# Patient Record
Sex: Female | Born: 1970 | Hispanic: Yes | Marital: Single | Smoking: Never smoker
Health system: Southern US, Community
[De-identification: ages and names within clinical notes are randomized; demographics above are authoritative.]

## PROBLEM LIST (undated history)

## (undated) DIAGNOSIS — E119 Type 2 diabetes mellitus without complications: Secondary | ICD-10-CM

---

## 2006-07-07 ENCOUNTER — Ambulatory Visit: Payer: Self-pay | Admitting: General Practice

## 2011-01-24 ENCOUNTER — Emergency Department: Payer: Self-pay | Admitting: Internal Medicine

## 2012-08-01 ENCOUNTER — Inpatient Hospital Stay: Payer: Self-pay | Admitting: Internal Medicine

## 2012-08-01 LAB — CBC WITH DIFFERENTIAL/PLATELET
Basophil #: 0 10*3/uL (ref 0.0–0.1)
Eosinophil %: 0 %
HCT: 37.9 % (ref 35.0–47.0)
HGB: 12.5 g/dL (ref 12.0–16.0)
Lymphocyte #: 0.7 10*3/uL — ABNORMAL LOW (ref 1.0–3.6)
MCHC: 33 g/dL (ref 32.0–36.0)
Monocyte #: 0.6 x10 3/mm (ref 0.2–0.9)
Monocyte %: 8.9 %
Neutrophil %: 79.7 %
Platelet: 140 10*3/uL — ABNORMAL LOW (ref 150–440)
WBC: 6.6 10*3/uL (ref 3.6–11.0)

## 2012-08-01 LAB — BASIC METABOLIC PANEL
Calcium, Total: 8.6 mg/dL (ref 8.5–10.1)
Chloride: 100 mmol/L (ref 98–107)
EGFR (African American): >60
EGFR (Non-African Amer.): >60

## 2012-08-01 LAB — URINALYSIS, COMPLETE
Glucose,UR: >=500 mg/dL (ref 0–75)
Leukocyte Esterase: NEGATIVE
Nitrite: NEGATIVE
RBC,UR: <1 /HPF (ref 0–5)
WBC UR: 2 /HPF (ref 0–5)

## 2012-08-01 LAB — WET PREP, GENITAL

## 2012-08-02 LAB — BASIC METABOLIC PANEL
Anion Gap: 14 (ref 7–16)
BUN: 7 mg/dL (ref 7–18)
BUN: 6 mg/dL — ABNORMAL LOW (ref 7–18)
Chloride: 107 mmol/L (ref 98–107)
Co2: 16 mmol/L — ABNORMAL LOW (ref 21–32)
Co2: 18 mmol/L — ABNORMAL LOW (ref 21–32)
EGFR (African American): >60
EGFR (Non-African Amer.): >60
Glucose: 124 mg/dL — ABNORMAL HIGH (ref 65–99)
Osmolality: 273 (ref 275–301)
Potassium: 3.5 mmol/L (ref 3.5–5.1)
Sodium: 137 mmol/L (ref 136–145)

## 2012-08-03 LAB — URINALYSIS, COMPLETE
Bilirubin,UR: NEGATIVE
Glucose,UR: >=500 mg/dL (ref 0–75)
Nitrite: NEGATIVE
Ph: 6 (ref 4.5–8.0)
Protein: NEGATIVE
RBC,UR: 1 /HPF (ref 0–5)
Specific Gravity: 1.003 (ref 1.003–1.030)
WBC UR: 3 /HPF (ref 0–5)

## 2012-08-03 LAB — CBC WITH DIFFERENTIAL/PLATELET
Basophil #: 0 10*3/uL (ref 0.0–0.1)
Basophil %: 0.4 %
Eosinophil #: 0 10*3/uL (ref 0.0–0.7)
Eosinophil %: 0.2 %
HCT: 36.3 % (ref 35.0–47.0)
HGB: 11.8 g/dL — ABNORMAL LOW (ref 12.0–16.0)
MCH: 27.2 pg (ref 26.0–34.0)
MCHC: 32.6 g/dL (ref 32.0–36.0)
Monocyte #: 0.7 x10 3/mm (ref 0.2–0.9)
Monocyte %: 9.5 %
Neutrophil %: 74.6 %
RBC: 4.35 10*6/uL (ref 3.80–5.20)
WBC: 7.2 10*3/uL (ref 3.6–11.0)

## 2012-08-03 LAB — BASIC METABOLIC PANEL
BUN: 4 mg/dL — ABNORMAL LOW (ref 7–18)
Calcium, Total: 7.8 mg/dL — ABNORMAL LOW (ref 8.5–10.1)
Co2: 19 mmol/L — ABNORMAL LOW (ref 21–32)
Creatinine: 0.38 mg/dL — ABNORMAL LOW (ref 0.60–1.30)
EGFR (Non-African Amer.): >60
Glucose: 148 mg/dL — ABNORMAL HIGH (ref 65–99)
Potassium: 3.1 mmol/L — ABNORMAL LOW (ref 3.5–5.1)
Sodium: 139 mmol/L (ref 136–145)

## 2012-08-03 LAB — URINE CULTURE

## 2012-08-04 LAB — BASIC METABOLIC PANEL
Anion Gap: 11 (ref 7–16)
BUN: 2 mg/dL — ABNORMAL LOW (ref 7–18)
Calcium, Total: 8.1 mg/dL — ABNORMAL LOW (ref 8.5–10.1)
Creatinine: 0.41 mg/dL — ABNORMAL LOW (ref 0.60–1.30)
EGFR (African American): >60
EGFR (Non-African Amer.): >60
Glucose: 138 mg/dL — ABNORMAL HIGH (ref 65–99)
Potassium: 3.1 mmol/L — ABNORMAL LOW (ref 3.5–5.1)
Sodium: 141 mmol/L (ref 136–145)

## 2012-08-07 LAB — CULTURE, BLOOD (SINGLE)

## 2012-08-26 LAB — URINALYSIS, COMPLETE
Bilirubin,UR: NEGATIVE
Glucose,UR: >=500 mg/dL (ref 0–75)
Protein: 30
RBC,UR: 3 /HPF (ref 0–5)
Specific Gravity: 1.02 (ref 1.003–1.030)
WBC UR: 86 /HPF (ref 0–5)

## 2012-08-26 LAB — CBC
HCT: 38.4 % (ref 35.0–47.0)
HGB: 12.5 g/dL (ref 12.0–16.0)
MCH: 27 pg (ref 26.0–34.0)
Platelet: 247 10*3/uL (ref 150–440)
RBC: 4.63 10*6/uL (ref 3.80–5.20)
RDW: 15.5 % — ABNORMAL HIGH (ref 11.5–14.5)

## 2012-08-26 LAB — COMPREHENSIVE METABOLIC PANEL
Alkaline Phosphatase: 132 U/L (ref 50–136)
Calcium, Total: 8.7 mg/dL (ref 8.5–10.1)
Co2: 20 mmol/L — ABNORMAL LOW (ref 21–32)
Creatinine: 0.56 mg/dL — ABNORMAL LOW (ref 0.60–1.30)
EGFR (African American): >60
Glucose: 233 mg/dL — ABNORMAL HIGH (ref 65–99)
Osmolality: 269 (ref 275–301)
SGPT (ALT): 50 U/L (ref 12–78)
Sodium: 131 mmol/L — ABNORMAL LOW (ref 136–145)
Total Protein: 8 g/dL (ref 6.4–8.2)

## 2012-08-27 ENCOUNTER — Inpatient Hospital Stay: Payer: Self-pay | Admitting: Internal Medicine

## 2012-08-28 LAB — CBC WITH DIFFERENTIAL/PLATELET
Eosinophil #: 0 10*3/uL (ref 0.0–0.7)
HCT: 36.7 % (ref 35.0–47.0)
HGB: 12 g/dL (ref 12.0–16.0)
MCHC: 32.8 g/dL (ref 32.0–36.0)
MCV: 83 fL (ref 80–100)
Monocyte #: 0.9 x10 3/mm (ref 0.2–0.9)
Monocyte %: 9 %
Neutrophil #: 7.5 10*3/uL — ABNORMAL HIGH (ref 1.4–6.5)
Platelet: 233 10*3/uL (ref 150–440)
RBC: 4.44 10*6/uL (ref 3.80–5.20)
WBC: 10 10*3/uL (ref 3.6–11.0)

## 2012-08-28 LAB — BASIC METABOLIC PANEL
Anion Gap: 11 (ref 7–16)
Creatinine: 0.48 mg/dL — ABNORMAL LOW (ref 0.60–1.30)
EGFR (Non-African Amer.): >60
Glucose: 272 mg/dL — ABNORMAL HIGH (ref 65–99)
Potassium: 3.4 mmol/L — ABNORMAL LOW (ref 3.5–5.1)

## 2012-08-29 LAB — CULTURE, BLOOD (SINGLE)

## 2012-09-04 LAB — CULTURE, BLOOD (SINGLE)

## 2013-05-05 ENCOUNTER — Emergency Department: Payer: Self-pay | Admitting: Internal Medicine

## 2013-05-05 LAB — BASIC METABOLIC PANEL
Anion Gap: 6 — ABNORMAL LOW (ref 7–16)
BUN: 10 mg/dL (ref 7–18)
Chloride: 102 mmol/L (ref 98–107)
Co2: 24 mmol/L (ref 21–32)
EGFR (African American): >60
EGFR (Non-African Amer.): >60
Glucose: 434 mg/dL — ABNORMAL HIGH (ref 65–99)
Sodium: 132 mmol/L — ABNORMAL LOW (ref 136–145)

## 2013-05-05 LAB — CBC
HCT: 35.3 % (ref 35.0–47.0)
HGB: 11.3 g/dL — ABNORMAL LOW (ref 12.0–16.0)
MCH: 24.7 pg — ABNORMAL LOW (ref 26.0–34.0)
RBC: 4.59 10*6/uL (ref 3.80–5.20)
RDW: 16.9 % — ABNORMAL HIGH (ref 11.5–14.5)
WBC: 7.9 10*3/uL (ref 3.6–11.0)

## 2013-05-05 LAB — HEPATIC FUNCTION PANEL A (ARMC)
Albumin: 3.7 g/dL (ref 3.4–5.0)
Alkaline Phosphatase: 109 U/L (ref 50–136)
Bilirubin, Direct: <0.1 mg/dL (ref 0.00–0.20)
Bilirubin,Total: 0.3 mg/dL (ref 0.2–1.0)
SGOT(AST): 32 U/L (ref 15–37)
SGPT (ALT): 69 U/L (ref 12–78)

## 2013-05-05 LAB — LIPASE, BLOOD: Lipase: 201 U/L (ref 73–393)

## 2014-06-29 IMAGING — CT CT ABD-PELV W/ CM
1 of 2 series · 15 of 32 positions shown, 19 images · IV contrast (isovue)
Comparison: 08/01/2012

REASON FOR EXAM: (1) Bacteremia, recent pyelonephritis.; (2) Bacteremia,
recent pyelonephritis.
COMMENTS:

PROCEDURE:     CT  - CT ABDOMEN / PELVIS  W  - August 28, 2012 [DATE]
RESULT:     History: Bacteremia, recent pyelonephritis
TECHNIQUE: Multiple axial images of the abdomen and pelvis were performed
from the lung bases to the pubic symphysis, with a p.o. contrast and with
100 ml of Isovue 300 intravenous contrast.

[Series 2: 3mm soft tissue · axial · 0.77mm/px · z∈[-304,+140]mm · 15 of 162 slices shown, 19 images]
[im 7/162  soft-tissue]
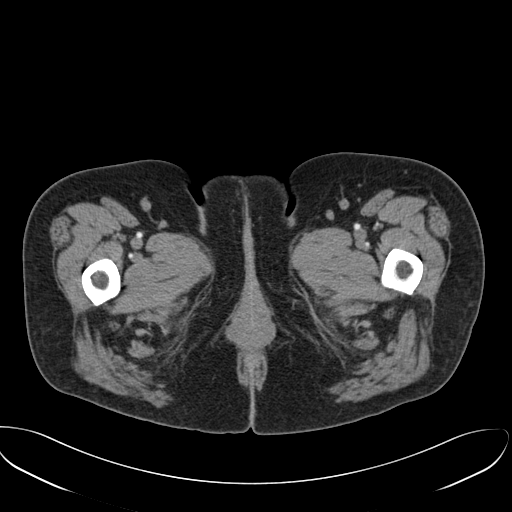
[im 7/162  bone]
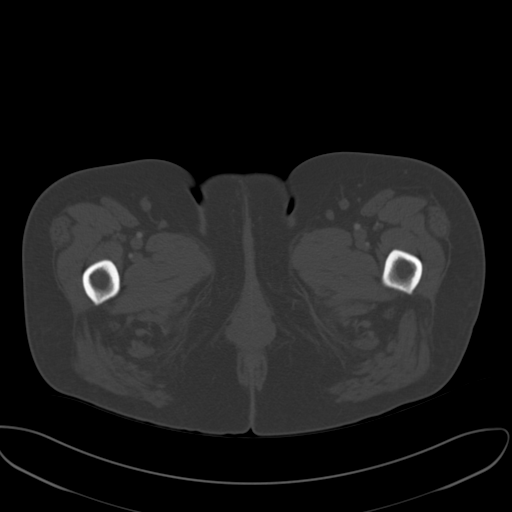
[im 21/162  soft-tissue]
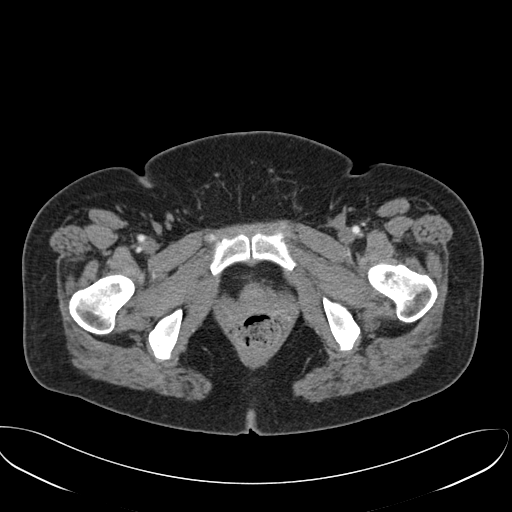
[im 34/162  soft-tissue]
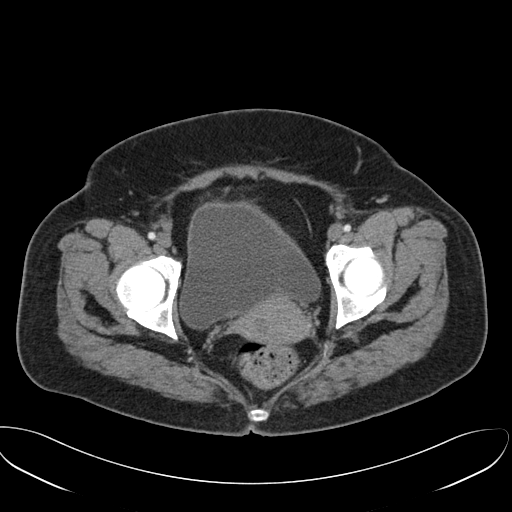
[im 47/162  soft-tissue]
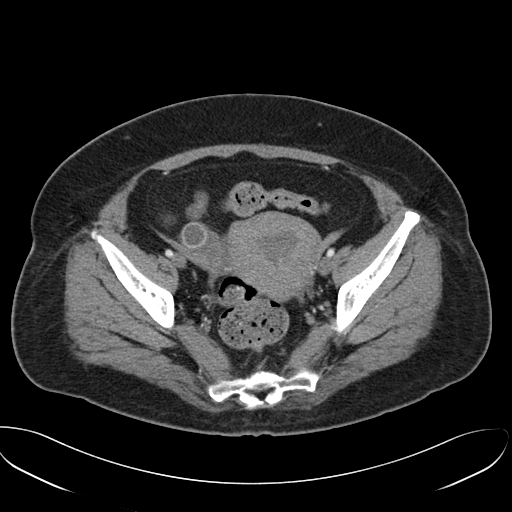
[im 54/162  soft-tissue]
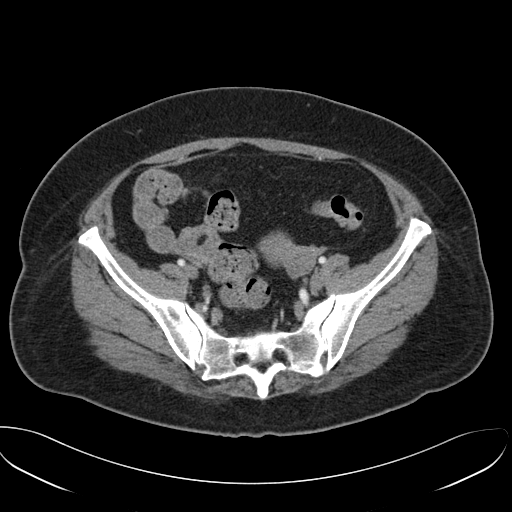
[im 68/162  soft-tissue]
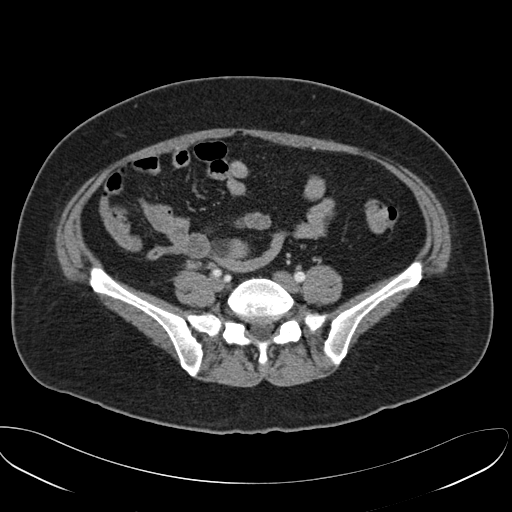
[im 81/162  soft-tissue]
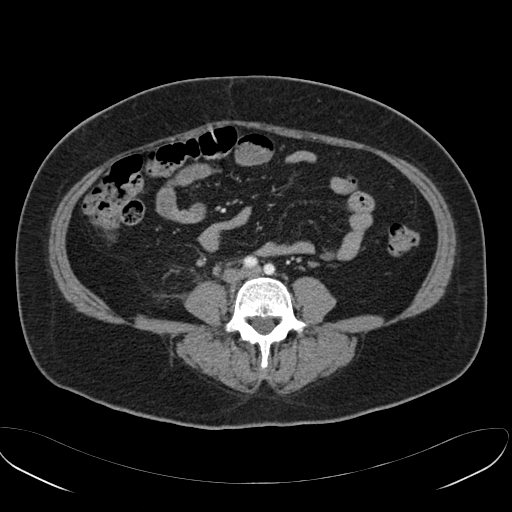
[im 94/162  soft-tissue]
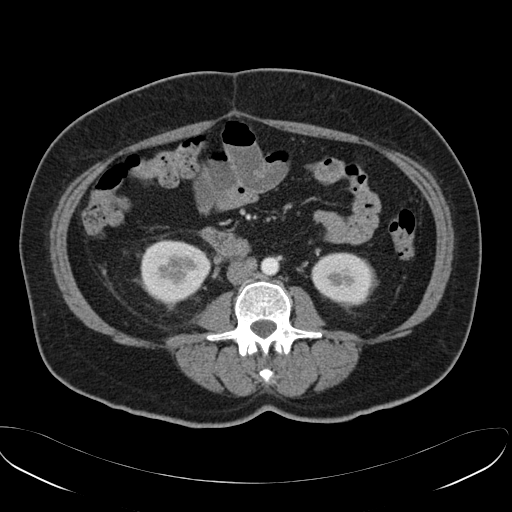
[im 108/162  soft-tissue]
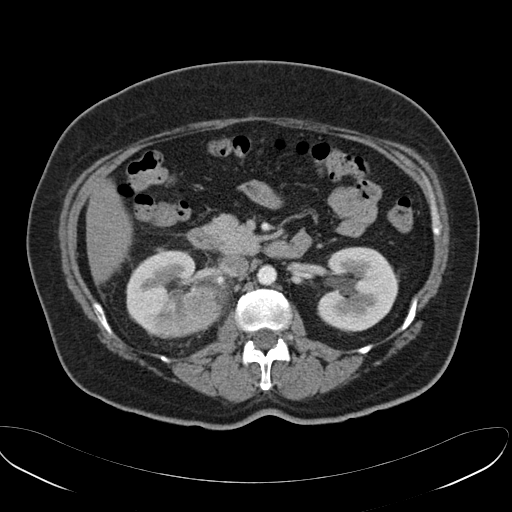
[im 108/162  bone]
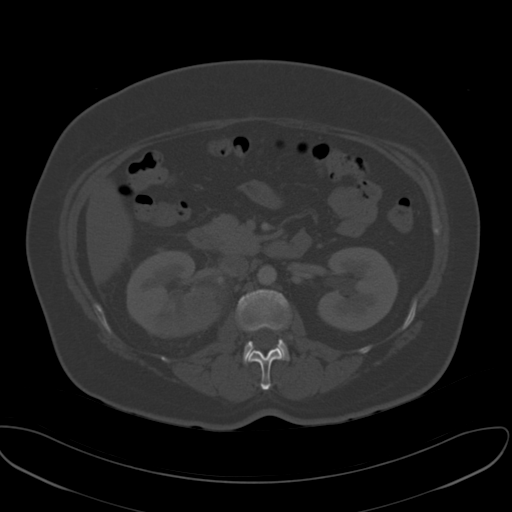
[im 115/162  soft-tissue]
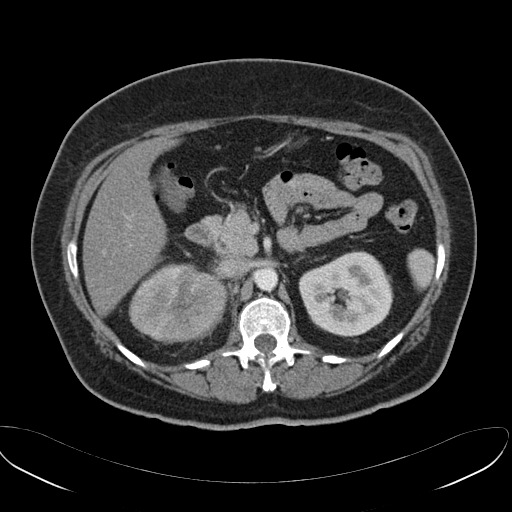
[im 128/162  soft-tissue]
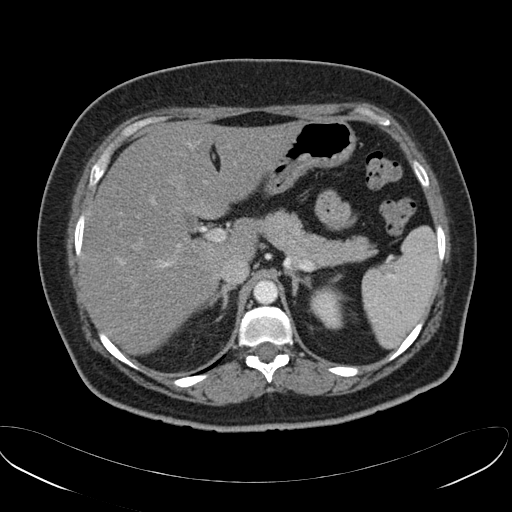
[im 135/162  lung]
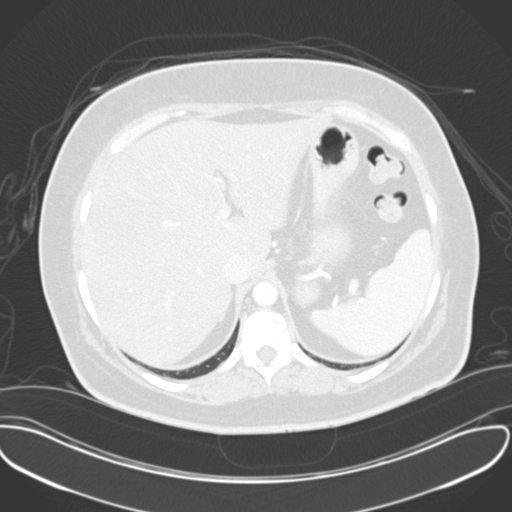
[im 141/162  soft-tissue]
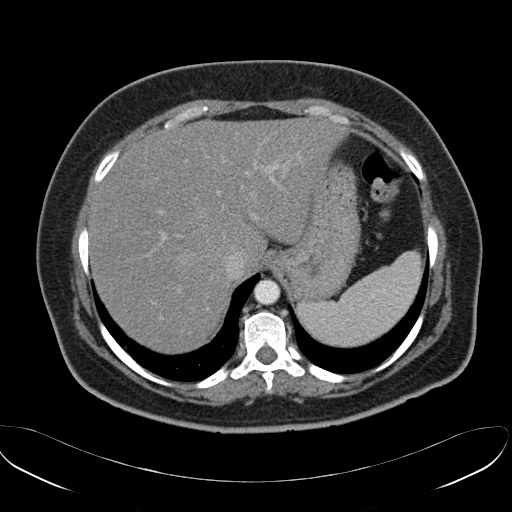
[im 141/162  lung]
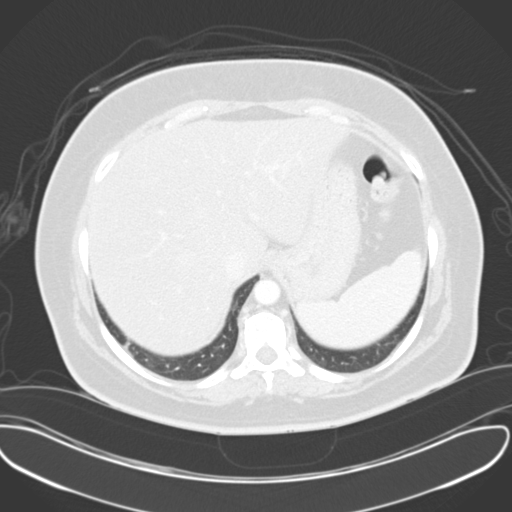
[im 148/162  lung]
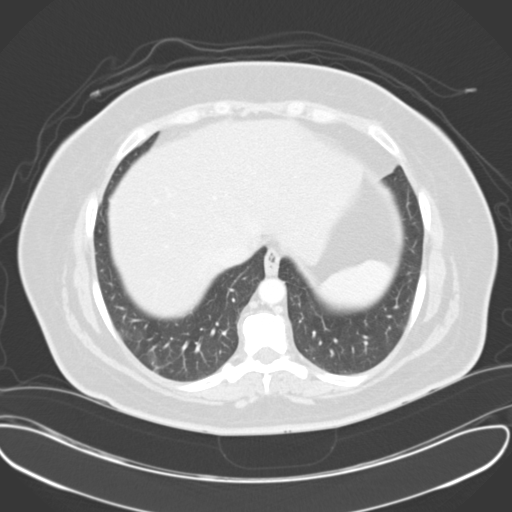
[im 155/162  soft-tissue]
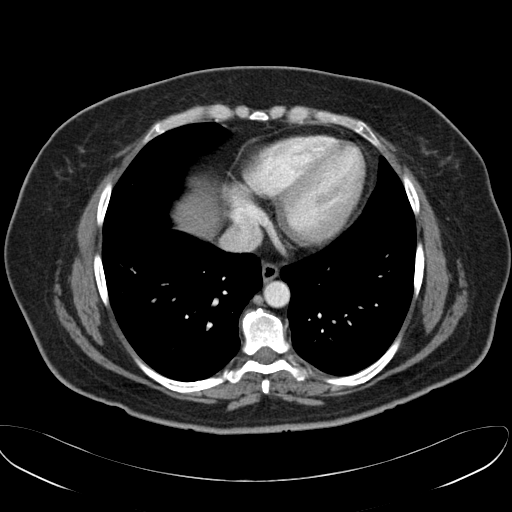
[im 155/162  lung]
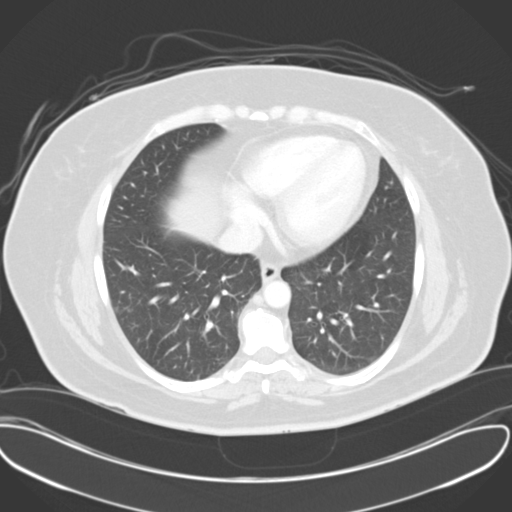

[15 of 32 positions shown; findings below may reference images not displayed]

FINDINGS: The lung bases are clear. There is no pneumothorax. The heart size is
normal.

The liver is a low in attenuation likely secondary to hepatic steatosis.
There is no intrahepatic or extrahepatic biliary ductal dilatation. The
gallbladder is unremarkable. The spleen demonstrates no focal abnormality.
The left kidney, adrenal glands, and pancreas are normal. There is severe
hypoenhancement of the upper half of the right kidney the most concerning
for pyelonephritis. There is no obstructive uropathy. The bladder is
unremarkable.

The stomach, duodenum, small intestine, and large intestine demonstrate no
contrast extravasation or dilatation.  There is no pneumoperitoneum,
pneumatosis, or portal venous gas. There is no abdominal or pelvic free
fluid. There is no lymphadenopathy.

The abdominal aorta is normal in caliber.

The osseous structures are unremarkable.
IMPRESSION: 1.  There is severe hypoenhancement of the upper half of the right kidney
the most concerning for pyelonephritis. Infectious disease consultation
recommended.

[REDACTED]

## 2014-11-16 NOTE — H&P (Signed)
PATIENT NAME:  [name], [name] MR#:  [id number] DATE OF BIRTH:  07-25-1971  DATE OF ADMISSION:  08/27/2012  REFERRING PHYSICIAN:  Dr. Janalyn HarderKaminski, David  PRIMARY CARE PHYSICIAN:  [facility], cannot recall the name.   CHIEF COMPLAINT:  Fever, chills, lower abdominal pain.   HISTORY OF PRESENT ILLNESS: This is a 44 year old Hispanic female with history of diabetes mellitus type 2 on metformin, Spanish-speaking, had to [facility]  for translation NAD. The patient was recently admitted and discharged from [facility] for what thought to be UTI/pyelonephritis, as well as Klebsiella pneumonia bacteremia. The patient was discharged home on p.o. Cipro. The patient presents with complaints of lower abdominal tenderness resembling the last episode, as well as fever, chills and feeling weak. The patient was found to be febrile of 102.9, had significant leukocytosis 15.5 and was tachycardic. The patient also did show 87 white blood cells. The patient had lower abdomen tenderness mainly on the right lower quadrant and reports this pain resembles the pain she had last time, denies any nausea, vomiting, constipation, diarrhea, bright red blood per rectum, chest pain or shortness of breath, as well the patient noticed some vaginal discharge during her last admission where she did have workup for chlamydia and gonococcal, which was negative. Hospital services requested to admit the patient for further management and treatment for her UTI.  PAST MEDICAL HISTORY:  Type 2 diabetes on metformin.   PAST SURGICAL HISTORY:  Bilateral partial salpingectomy in 2002 for sterilization.   SOCIAL HISTORY:  Nonsmoker. No history of alcohol or drug abuse.  She is married, living with her husband.  Speaks good English.  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   FAMILY HISTORY:  Significant for diabetes mellitus type 2 in her mother.   HOME  MEDICATIONS:  Metformin 1000 mg p.o. b.i.d. Finished her course of Cipro.   ALLERGIES:  No known drug allergies.   REVIEW OF SYSTEMS:  The patient denies any fever, but has complaints of chills, rigors and fatigue and weakness.  EYES:  Denies blurry vision, double vision, inflammation or glaucoma and she denies tinnitus, ear pain, hearing loss, epistaxis or discharge.  RESPIRATORY:  Denies cough, wheezing, hemoptysis, dyspnea.  CARDIOVASCULAR: Denies chest pain, orthopnea, edema, arrhythmia, palpitations, syncope.  GASTROINTESTINAL:  Denies nausea, vomiting, diarrhea. Complains of abdominal pain mainly in the lower abdominal area more pronounced on the right lower quadrant, denies any rectal bleed, constipation or hemorrhoids.  GENITOURINARY:  Complains of dysuria. Denies hematuria or renal colic and denies polyuria, polydipsia, heat or cold intolerance.  HEMATOLOGY:  Denies anemia, easy bruising, bleeding, diathesis.  INTEGUMENTARY:  Denies acne, rash or skin lesions.  MUSCULOSKELETAL:  Denies neck pain, shoulder pain, knee pain, arthritis, cramps or gout.  NEUROLOGICAL:  Denies numbness, dysarthria, epilepsy, tremors, vertigo, CVA, TIA or seizures.  PSYCHIATRIC:  Denies anxiety, schizophrenia, substance abuse, alcohol abuse, bipolar disorder.   PHYSICAL EXAMINATION: VITAL SIGNS:  Temperature 98.3, T-max 102.9, pulse 115, respiratory rate 20, blood pressure 115/67, saturating 98% on room air.  GENERAL:  Well-nourished female looks comfortable in no apparent distress.  HEENT:  Head atraumatic, normocephalic. Pupils equal, reactive to light. Pink conjunctivae. Anicteric sclerae. Moist oral mucosa.  NECK:  Supple. No thyromegaly. No JVD.  CHEST:  Good air entry bilaterally. No wheezing, rales, rhonchi. ABDOMEN:  Has right lower abdomen tenderness, as well in the middle lower abdomen area to a lesser  degree but no rebound and no guarding. Bowel sounds present. No hepatosplenomegaly. CVA tenderness  was not noticed in either side.  EXTREMITIES:  No edema. No clubbing. No cyanosis.  PSYCHIATRIC:  Appropriate affect. Awake, alert x 3. Intact judgment and insight.  NEUROLOGIC:  Cranial nerves grossly intact. Motor is 5/5.  SKIN:  Normal skin turgor. Warm and dry.   PERTINENT LABORATORIES:  Urine pregnancy test negative. Glucose 233, BUN 9, creatinine 0.56, sodium 131, potassium 3.6, chloride 100, CO2 of 20. White blood cells 15.5, hemoglobin 12.5, hematocrit 38.4, platelets 247. Urinalysis showing leukocyte esterase +3 with 86 white blood cells.   ASSESSMENT AND PLAN: 1.  Sepsis:  This is most likely related to urinary tract infection. The patient had recent CT abdomen with IV contrast on the 6th of January which did not show any acute finding beside right perinephric stranding. The patient will be started on Rocephin instead of Cipro as had recurrence with her urinary tract infection on the Cipro. Will recheck blood cultures, will follow on urine culture and will adjust antibiotics if needed, and given the fact the patient had lower abdominal tenderness, will check transvaginal ultrasound even though she had negative workup for chlamydia and gonococcal during her last admission. 2.  Hyponatremia:  This is most likely due to dehydration and poor p.o. intake. We will continue with normal saline. We will recheck in a.m. 3.  Diabetes mellitus:  Will hold metformin. We will have her on insulin sliding scale. 4.  Deep vein thrombosis prophylaxis:  Subcutaneous heparin, GI prophylaxis on Protonix.  CODE STATUS:  FULL CODE.  TOTAL TIME SPENT ON ADMISSION AND PATIENT CARE:  55 minutes.   ____________________________ Starleen Arms, MD dse:ce D: 08/27/2012 01:34:44 ET T: 08/27/2012 12:55:14 ET JOB#: [id number]  cc: Starleen Arms, MD, <Dictator> Ruston Fedora Teena Irani MD ELECTRONICALLY SIGNED 08/29/2012 2:14

## 2014-11-16 NOTE — Discharge Summary (Signed)
PATIENT NAME:  [name], [name] MR#:  [id number] DATE OF BIRTH:  October 06, 1970  DATE OF ADMISSION:  08/01/2012 DATE OF DISCHARGE:  08/04/2012  ADMITTING PHYSICIAN:  Carney CornersAmir M. Rudene Rearwish, MD  DISCHARGING PHYSICIAN:  Enid Baasadhika Analyssa Downs, MD  PRIMARY CARE PHYSICIAN:  None.   CONSULTATIONS IN THE HOSPITAL:  Endocrinology consultation by Dr. Tedd SiasSolum.   DISCHARGE DIAGNOSES:   1.  Systemic inflammatory response syndrome.  2.  Acute pyelonephritis.  3.  Bacteremia.  4.  Klebsiella pneumonia.  5.  Diabetic ketoacidosis on admission.  6.  Uncontrolled type 2 diabetes mellitus with hemoglobin A1c of 11.8.  7.  Hypokalemia.   DISCHARGE MEDICATIONS:   1.  Metformin 1000 mg p.o. t.i.d.  2.  Ciprofloxacin 500 mg p.o. b.i.d. for 11 more days.   DISCHARGE DIET:  ADA 1800 diet.   DISCHARGE ACTIVITY:  As tolerated.   FOLLOWUP INSTRUCTIONS:   1.  PCP followup in 2 weeks.  2.  Follow up with Dr. Tedd SiasSolum in 2 weeks.   LABS AND IMAGING STUDIES PRIOR TO DISCHARGE:   1.  WBC 7.3, hemoglobin 11.3, hematocrit 36.3, platelet count 154.  2.  Sodium 141, potassium 3.1, chloride 108, bicarbonate 22, BUN 2, creatinine 0.4, glucose 138, calcium 8.1.  3.  Urinalysis negative for any infection.  4.  Repeat blood cultures are negative. Initial blood cultures on admission were positive for Klebsiella pneumonia pretty much pansensitive. Her chlamydia and gonococcal DNA probe from vaginal secretions has being negative.   BRIEF HOSPITAL COURSE:  The patient is a 44 year old Hispanic female with history of type 2 diabetes mellitus on metformin who has not been taking her medications for the past month who presented to the hospital secondary to fever, chills and elevated blood sugar.  1.  Systemic inflammatory response syndrome secondary to acute pyelonephritis. She did have positive blood cultures with Klebsiella pneumonia and I am not sure what the source is, possibly UTI and pyelonephritis as she did have some urinary symptoms on  admission. She was spiking high fevers and initially started on IV Invanz until sensitivities are back and now changed to p.o. Cipro for 11 more days to finish a 2-week course of antibiotics. Her repeat blood cultures prior to discharge have been negative so far.  2.  DKA secondary to noncompliance. Hemoglobin A1c is elevated. The patient cannot take insulin secondary to her _____. She probably will have better control if she will be compliant with her oral medications. She was seen by Dr. Tedd SiasSolum from an endocrinology standpoint and was started on metformin 1000 mg p.o. b.i.d. and the patient was stressed the importance of taking her medication and checking her sugars at least twice a day. She will follow up with Dr. Tedd SiasSolum in the clinic on 08/19/2012. Her sugars have been better controlled less than _____ prior to discharge.  3.  Hypokalemia. Potassium is low and will be replaced while in the hospital.   Her course has been otherwise uneventful in the hospital.   DISCHARGE CONDITION:  Stable.   DISCHARGE DISPOSITION:  Home.   TIME SPENT ON DISCHARGE:  45 minutes.    ____________________________ Enid Baasadhika Elissia Spiewak, MD rk:si D: 08/04/2012 14:39:00 ET T: 08/04/2012 17:19:21 ET JOB#: [id number]  cc: Enid Baasadhika Tayva Easterday, MD, <Dictator> A. Wendall MolaMelissa Solum, MD Enid BaasADHIKA Marili Vader MD ELECTRONICALLY SIGNED 08/05/2012 14:14

## 2014-11-16 NOTE — Consult Note (Signed)
PATIENT NAME:  [name], [name] MR#:  [id number] DATE OF BIRTH:  08/23/1970  DATE OF CONSULTATION:  08/02/2012  REFERRING PHYSICIAN:  Enid Baas, MD CONSULTING PHYSICIAN:  A. Wendall Mola, MD  CHIEF COMPLAINT: Diabetes with diabetic ketoacidosis.   HISTORY OF PRESENT ILLNESS: This is a 44 year old female seen in consultation, who was admitted yesterday with severe hyperglycemia. She presented initially to [name] acute care with complaints of fevers, chills and myalgias. Labs showed an elevated glucose of 308 with a sodium of 127 and evidence of a low bicarb and anion gap. She was sent to the [facility] ED. Labs then showed a blood glucose of 273 with a sodium of 129, bicarbonate of 11 and elevated anion gap of 18. Urinary glucose was greater than 500 and she had 2+ urinary ketones,  consistent with diabetic ketoacidosis. The patient reports she had had a diagnosis of diabetes for at least 15 years. In the past, she had been treated with metformin. She had not been taking her metformin for at least a month. She simply had not picked up refills of that medication. She was not checking her blood sugars. She has had no prior hospitalizations for diabetes. She has no known complications from diabetes. She had been managed in the past at [facility] clinic.   The patient was admitted and placed on IV fluids and IV insulin. Blood sugars greatly improved. She was transitioned to subcutaneous insulin early this morning. Today, she has received Lantus 8 units around 8:00 a.m. in addition to regular insulin sliding scale. Her 8:00 a.m. blood glucose was 137 and an 11:30 a.m. blood glucose was 206, at which time she was given regular insulin 4 units. Her appetite is good. She claims to be eating a low carb diet. She claims she tends to avoid any meat and starch products. She claims to be eating most of her meal trays. She denies nausea. She had some abdominal and pelvic pain yesterday, this has since resolved.  Her current hemoglobin A1c is 11.6% and diabetes is severely uncontrolled. Additional labs yesterday showed a urinary tract infection and a blood culture has since grown out gram-negative rods. CT scan, non-con, was also done yesterday and was notable for nonspecific right perinephritic stranding as well as hepatic steatosis. There is concern that the patient has acute pyelonephritis with bacteremia.   PAST MEDICAL HISTORY:  Diabetes.   PAST SURGICAL HISTORY:  Tubal ligation 2002.   SOCIAL HISTORY: Married. She has 4 children. She is employed. She denies use of tobacco or alcohol.   FAMILY HISTORY: Positive for diabetes.   ALLERGIES: No known drug allergies.   INPATIENT MEDICATIONS:  1.  Lantus 8 units subcu b.i.d.  2.  Regular insulin sliding scale before meals and at bedtime.  3.  Ceftriaxone 250 mg daily.  4.  Zosyn 3.375 mg every 8 hours.  5.  Heparin 5000 units subcu every 12 hours.   REVIEW OF SYSTEMS: GENERAL: No weight loss. She does report intermittent fevers.  HEENT: Denies blurred vision. Denies sore throat.  NECK: Denies neck pain. Denies dysphasia.  CARDIAC: Denies chest pain. Denies palpitations.  PULMONARY: Denies cough. Denies shortness of breath.  ABDOMEN: Reports abdominal pain has resolved. Reports fair appetite. Denies nausea.  EXTREMITIES: Denies leg swelling. Denies muscle weakness.  ENDOCRINE: Denies heat or cold intolerance.  GENITOURINARY:  Denies dysuria at this time. Was having polyuria and polydipsia prior to admission, this has improved.  SKIN: Denies rash or dermatopathy.   PHYSICAL EXAMINATION:  VITAL SIGNS: Temperature 99.2, pulse 110, respirations 22, blood pressure 108/72, pulse ox 96% on room air.  GENERAL: Obese Hispanic female in no acute distress.  HEENT: EOMI.  Oropharynx is clear.  Mucous membranes moist.  NECK: Supple. No appreciable thyromegaly.  LYMPH: No submandibular or anterior cervical lymphadenopathy.  CARDIAC: Tachycardia without  murmur.  PULMONARY: Clear to auscultation bilaterally. No wheeze.  ABDOMEN: Diffusely soft, nontender, nondistended. Normal abdominal bowel sounds.  EXTREMITIES: No edema is present.  NEUROLOGIC: No elicited neurologic deficits including sensory numbness.  SKIN: No rash or dermatopathy is present.  PSYCHIATRIC: Alert and oriented x 3, calm and cooperative.   LABORATORY: Glucose 153, BUN 6, creatinine 0.44, sodium 135, potassium 5.2, bicarb 18, anion gap 10, calcium 7.8.   ASSESSMENT: This is a 44 year old female with long-standing uncontrolled diabetes, admitted with diabetic ketoacidosis and evidence of pyelonephritis and bacteremia, consistent with sepsis.   RECOMMENDATIONS: 1.  Agree with use of subcutaneous insulin at this time. No changes were made in Lantus or regular insulin sliding scale.  2.  Her creatinine has normalized and she is eating a good diet without any GI symptoms. I plan to restart metformin at 500 mg b.i.d.  3.  Anticipate the patient to be discharged on oral hypoglycemics alone. For now, continue the Lantus and we can reassess tomorrow.  4.  Counseled patient about the importance of better glycemic control as well as regular blood sugar monitoring. She assures me she has a glucometer. She is advised to monitor her blood sugars twice daily.  5.  She would benefit from outpatient diabetes education.  6.  Patient agreed to outpatient followup with me. I will set this up in about 2 weeks. She should bring her glucometer to her clinic visit.   Thank you for the kind request for consultation. I will follow along with you.      ____________________________ A. Wendall MolaMelissa Ysenia Filice, MD ams:cs D: 08/02/2012 17:04:24 ET T: 08/02/2012 18:10:02 ET JOB#: [id number]  cc: A. Wendall MolaMelissa Jais Demir, MD, <Dictator> Macy MisA. MELISSA Jerusalem Wert MD ELECTRONICALLY SIGNED 08/05/2012 16:57

## 2014-11-16 NOTE — Consult Note (Signed)
Chief Complaint and History:   Referring Physician Dr. Nemiah CommanderKalisetti    Chief Complaint DKA   Allergies:  No Known Allergies:   Assessment/Plan:   Assessment/Plan Patient was seen, interviewed, examined, and chart was reviewed. This is a 44 yo f with a 15 yr h/o diabetes admitted with hyperglycemia (glc 300s) and metabolic acidosis and ketosis, c/w DKA as well as pyelonephritis. She was initiated on IV insulin and transitioned to SQ insulin earlier today. She is receiving Lantus 8 units bid and Regular insulin SSI qACHS. Sugars are in the 137-206 range since this morning. She is tolerating her diet. No N/V/abd pain. Cause of uncontrolled diabetes/DKA was non-compliance with metformin and infection.  A/ 1. DKA 2. Acute pyelonephritis  P/ 1. Continue Lantus at current dose. 2. Start metformin 500 mg bid. 3. Anticipate DC home on oral anti-diabetics alone. Will see her again tomrrow and assist with managing her DC meds. 4. Anticipate clinic followup in about 2 weeks. 5. Needs to monitor sugars regularly, preferably 2x per day.    Case Discussed With patient   Electronic Signatures: Raj JanusSolum, Sumie Remsen M (MD)  (Signed 07-Jan-14 14:28)  Authored: Chief Complaint and History, ALLERGIES, Assessment/Plan   Last Updated: 07-Jan-14 14:28 by Raj JanusSolum, Olawale Marney M (MD)

## 2014-11-16 NOTE — Consult Note (Signed)
Impression: 44yo female w/ h/o DM admitted with recurrent pyelonephritis with bacteremia.  She was here a month ago with the same bacteria in the blood.  While it is possible that she developed a separate unique infection, it is more likely that she recrudesced.  I am unclear why she did not improve on cipro as her isolate was sensitive to fluoroquinolones and the newest culture continues to be sensitive.  She states that she took all her meds. She is clinically doing well.  Will change her to oral keflex.   Would treat for 3 weeks total. We discussed the need to control her BS and keep hydrated. 5) The CT scan shows more extensive involvement of the kidney on the right, but no obstruction.    Electronic Signatures: [name], [name] (MD) (Signed on 03-Feb-14 15:36)  Authored   Last Updated: 03-Feb-14 15:46 by Manmeet Arzola, [name] (MD)

## 2014-11-16 NOTE — Consult Note (Signed)
Brief Consult Note: Diagnosis: Pyelonephritis.   Patient was seen by consultant.   Consult note dictated.   Recommend further assessment or treatment.   Orders entered.   Discussed with Attending MD.   Comments: UTI prevention measures discussed with patient and written prevention  measures given to patient in Spanish/English  translation. No urological surgery indicated at this time.  Electronic Signatures: Orson ApeWolff, Jarrick Fjeld R (MD)  (Signed 03-Feb-14 09:48)  Authored: Brief Consult Note   Last Updated: 03-Feb-14 09:48 by Orson ApeWolff, Akeya Ryther R (MD)

## 2014-11-16 NOTE — Discharge Summary (Signed)
PATIENT NAME:  [name], [name] MR#:  [id number] DATE OF BIRTH:  15-Oct-1970  DATE OF ADMISSION:  08/27/2012 DATE OF DISCHARGE:  08/30/2012  PR[facility] CARE [facility]YSICIAN[facility] At [facility].   DISCHARGE DIAGNOSES:  1.  Klebsiella bacteremia.  2.  Klebsiella urinary tract infection. 3.  Klebsiella right pyelonephritis.  4.  Uncontrolled diabetes mellitus.  5.  Sepsis.   IMAGING STUDIES: Include: 1.  Transvaginal ultrasound which showed no acute abnormalities.  2.  CT scan of the abdomen showed right pyelonephritis without any abscess.   CONSULTANTS: Dr. Leavy CellaBlocker.   ADMITTING HISTORY AND PHYSICAL: Please see detailed H and P dictated on 08/27/2012. In brief, this 44 year old female patient with prior history of pyelonephritis and Klebsiella bacteremia presented to the Emergency Room complaining of right lower quadrant pain and burning on urination. The patient was found to have UTI with possibility of pyelonephritis and repeat bacteremia, was admitted to the hospitalist service for further workup and treatment.   HOSPITAL COURSE:  1.  Klebsiella bacteremia. The patient had blood cultures positive for Klebsiella bacteremia. Dr. Leavy CellaBlocker of ID was consulted secondary to recurrent bacteremia, who suggested changing the patient to Keflex after the patient was afebrile. The patient will continue and finish 3 weeks of oral Keflex. Also urology, Dr. Evelene CroonWolff, was consulted to look for any anatomical lesions or further workup secondary to recurrent pyelonephritis, who did not suggest any further workup and only antibiotic treatment.  2.  Uncontrolled diabetes mellitus. The patient was resumed on metformin after she received contrast after 48 hours. The patient has also been started on glipizide and her blood sugars are much improved and will follow up with her primary care physician. The patient has been given diabetes education and will be on a diabetic diet.  3.  Sepsis, resolved.   DISCHARGE CONDITION: On day  of discharge, the patient is afebrile, blood pressure 108/74, pulse 75, and is being discharged home in a fair condition.   DISCHARGE MEDICATIONS: Include:  1.  Keflex 500 mg oral every 8 hours for 20 days.  2.  Glipizide 5 mg oral once a day before meals in the morning.  3.  Metformin 1000 mg oral twice a day.   DISCHARGE INSTRUCTIONS: The patient will be on a diabetic diet. Activity as tolerated. Follow up with primary care physician in 1 to 2 weeks.   TIME SPENT ON DAY OF DISCHARGE IN DISCHARGE ACTIVITY: 35 minutes.   ____________________________ Molinda BailiffSrikar R. Lark Runk, MD srs:jm D: 08/30/2012 15:13:37 ET T: 08/30/2012 16:43:55 ET JOB#: [id number]  cc: Wardell HeathSrikar R. Kendalynn Wideman, MD, <Dictator> [facility]les [facility] Orie FishermanSRIKAR R Makyna Niehoff MD ELECTRONICALLY SIGNED 09/01/2012 13:27

## 2014-11-16 NOTE — H&P (Signed)
PATIENT NAME:  [name], [name] MR#:  [id number] DATE OF BIRTH:  Jun 12, 1971  DATE OF ADMISSION:  08/01/2012  PRIMARY CARE PHYSICIAN: [facility]. She does not know the name of the doctor. She went for the first time today, stating that she saw a female doctor. Blood workup done there reveals the name of Dr. Fay Records.   REFERRING PHYSICIAN: Dr. Governor Rooks.   CHIEF COMPLAINT: Three days of fever and chills and uncontrolled blood sugar reaching 300. The patient was referred from [facility] with DKA.   HISTORY OF THE PRESENT ILLNESS: The patient is a 44 year old Hispanic female with a history of diabetes mellitus type 2, on metformin; however, she did not take this medication for more than a month, stating that she does not have time to fill because she is working 2 jobs. The only medication currently taking is Tylenol. She indicates that over the last 3 days she is having fever and chills and feeling achy. Symptoms worsened. She also reports some epigastric pain. This appears to be mild, associated with postprandial fullness feeling; however, she has another pain at the pelvic area or suprapubic area. This is described as moderate to severe pain, dull in nature, constant for the last 3 days; however, symptoms improved after receiving antibiotics here and 1 dose of morphine. She reports no dysuria. No frequency of urination but reported that her urine was darker in color. The patient denies having any vaginal discharge; however, the Emergency Room physician examined her and reports mild vaginal discharge for which she is being treated and cultures taken. I would like to mention that the patient went to [facility] today around noon. She had blood workup done there and that revealed elevated blood sugar reaching 308. Her sodium was down to 127. She was acidotic with a high anion gap. She was referred to the Emergency Department to be admitted to be treated for diabetic ketoacidosis.   REVIEW OF  SYSTEMS:   CONSTITUTIONAL: She reports fever and chills over the last 3 days. Mild fatigue.  EYES: No blurring of vision or double vision other than her chronic lazy eye, that is the left eye with mild squint and decreased vision.  EARS, NOSE, THROAT: No hearing impairment. No sore throat. No dysphagia.  CARDIOVASCULAR: No chest pain. No shortness of breath. No syncope. No edema.  RESPIRATORY: No cough. No congestion. No sputum production. No chest pain. No shortness of breath.  GASTROINTESTINAL: Reports no vomiting. No diarrhea but reports postprandial epigastric discomfort and fullness. Also suprapubic and pelvic pain as described above. She also has back pain on both flanks posteriorly.  GENITOURINARY: Denies dysuria and no frequency, but there is a change in the urine color.  MUSCULOSKELETAL: No joint pain other than the back pain. No joint swelling. No muscular pain or swelling.  INTEGUMENTARY: No skin rash. No ulcers.  NEUROLOGY: No focal weakness. No seizure activity. No headache.  PSYCHIATRY: No anxiety. No depression.  ENDOCRINE: No polyuria or polydipsia. No heat or cold intolerance.  HEMATOLOGY: No easy bruisability. No lymph node enlargement.   PAST MEDICAL HISTORY: Diabetes mellitus type 2 on metformin. She has not taken this medication for more than a month. Obesity.   PAST SURGICAL HISTORY: Bilateral partial salpingectomy in 2002 for sterilization.   SOCIAL HABITS: Nonsmoker. No history of alcohol or drug abuse.   SOCIAL HISTORY: She is married, living with her husband. She is Hispanic but speaks [place]. 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  FAMILY HISTORY: Her mother suffers from diabetes mellitus type 2. Her father is healthy, but he is alcoholic.   ADMISSION MEDICATIONS: Currently taking only Tylenol p.r.n. She is supposed to take metformin; however, she did not take this medication for more than a month.   ALLERGIES: No known drug allergies.    PHYSICAL EXAMINATION:  VITAL SIGNS: Blood pressure 119/64, respiratory rate 18, pulse 115, temperature 99.2, pulse oximetry 99%.  GENERAL APPEARANCE: Young female lying in bed in no acute distress.  HEAD AND NECK: No pallor. No icterus. No cyanosis. Ear examination revealed normal hearing. No discharge. No lesions. Nasal mucosa was normal without discharge. No bleeding. No ulcers. Oropharyngeal area was normal without oral thrush; however, the tongue is coated with whitish material but does not appear to be fungal infection. No exudates. Eye examination revealed normal eyelids and conjunctivae. The left eye has a squint. The pupil is about 4 to 5 mm, round and reactive to light. Neck is supple. Trachea at midline. No thyromegaly. No cervical lymphadenopathy. No masses.  HEART: Normal S1, S2. No S3, S4. No murmur. No gallop. No carotid bruits.  LUNGS: Normal breathing pattern without use of accessory muscles. No rales. No wheezing.  ABDOMEN: Soft without tenderness, only mild at the time of my examination at the suprapubic area. No rebound. No rigidity. No hepatosplenomegaly. No masses. No hernias.  SKIN: No ulcers. No subcutaneous nodules.  MUSCULOSKELETAL: No joint swelling. No clubbing.  NEUROLOGIC: Cranial nerves II through XII are intact with the exception of the left eye squint. This is an old finding. No focal motor deficit. Sense of touch is preserved.  PSYCHIATRIC: The patient is alert and oriented x3. Mood and affect were normal.   LABORATORY FINDINGS: CAT scan of the abdomen and pelvis revealed no evidence of urolithiasis or obstructive uropathy. There is mild nonspecific right perinephritic stranding. This may be reactive versus secondary to infection. There is also noted hepatic steatosis. Chest x-ray showed no acute cardiopulmonary abnormality. Blood workup: Earlier at noon, her blood sugar was 308; now it is 273. Sodium 129, potassium 4.3, BUN 10, creatinine 0.5, bicarbonate 11, anion  gap elevated at 18. Calcium 8.7. The ABG showed a pH of 7.30. This is a venous sample. PCO2 was 28. Lactic acid was 1.1. CBC showed white count of 6000, hemoglobin 12, hematocrit 37, platelet count 140. Influenza A and B were negative. Urinalysis showed cloudy urine, 100 mg of protein, glucose more than 500, two white blood cells and 1 RBC. Vaginal wet preparation showed a few white blood cells seen. No trichomonas or spermatozoa. No yeast or clue cells.   ASSESSMENT:  1. Diabetic ketoacidosis, perhaps secondary to noncompliance with the medication in addition to the recent infection.  2. Pelvic inflammatory disease is suspected. There was some vaginal discharge by examination. Culture was taken for chlamydia and GC probe.  3. Possible early pyelonephritis. There is back pain that the patient describes and CAT scan showed some right perinephric fat stranding, although this is nonspecific but could be an early infection. Her urinalysis is negative but will await urine culture results.  4. Hyponatremia.  5. Hepatic steatosis or fatty infiltration of the liver.  6. Obesity.   PLAN: Will admit the patient to the intensive care unit. Aggressive IV hydration with normal saline. Monitor electrolytes. IV insulin drip to control blood sugar and to correct the metabolic acidosis. If her blood sugar drops below 150, will start D5W and follow the protocol with continuation of insulin drip  until clearance of her metabolic acidosis. I will keep the patient n.p.o. for the time being except for medications. The patient received a dose of Rocephin 250 mg intravenously along with 1 dose of Zithromax 1 g orally. I will continue Rocephin 1 g daily to cover for possibility of early acute pyelonephritis until results of the urine culture. If urine culture is negative, will stop this antibiotic. Pain control if needed. Right now, the patient is pain-free. For deep vein thrombosis prophylaxis, will place the patient on heparin  subcutaneous twice a day. Follow up on the results of the urine culture, also on chlamydia and GC amplification test.   Time spent in evaluating this patient took more than 55 minutes.    ____________________________ Carney Corners. Rudene Re, MD amd:gb D: 08/01/2012 23:35:33 ET T: 08/02/2012 05:18:30 ET JOB#: [id number]  cc: Carney Corners. Rudene Re, MD, <Dictator> Zollie Scale MD ELECTRONICALLY SIGNED 08/04/2012 22:47

## 2014-11-16 NOTE — Consult Note (Signed)
PATIENT NAME:  [name], Reni MR#:  [id number] DATE OF BIRTH:  1970-10-17  DATE OF CONSULTATION:  08/29/2012  REFERRING PHYSICIAN:  Milagros LollSrikar Sudini, MD CONSULTING PHYSICIAN:  Rosalyn GessMichael E. Xzavien Harada, MD  REASON FOR CONSULTATION: Recurrent pyelonephritis with bacteremia.   HISTORY OF PRESENT ILLNESS: The patient is a 44 year old female with a past history significant for diabetes and recent Klebsiella bacteremia, who was admitted on February 1 for shaking, chills, fever and right lower abdominal pain. The patient had been admitted last month with  Klebsiella bacteremia. The discharge summary indicates that a source was thought to be pyelonephritis. She was found to have Klebsiella in the blood. She was treated eventually with Cipro as an outpatient. She states she took all 11 days of the Cipro after her discharge on January 9. The day prior to admission; however, she began having shaking chills, subjective fever and right lower abdominal pain. These are similar symptoms to what she had when she was admitted in early January. She came to the hospital and was subsequently readmitted. She was given a dose of Cipro initially and then she was put on ceftriaxone and vancomycin. The vancomycin was subsequently stopped when her cultures again returned with Klebsiella in both the urine the blood. A CT scan of the abdomen and pelvis demonstrated evidence for pyelonephritis. Comparison to the prior CT scan from January 6 showed significantly increased evidence for infection. Since being in the hospital, her fevers and chills and sweats have resolved.  Her malaise is much improved. Her abdominal pain is resolved. She is eating and drinking fairly well. She states that her sugars have been in the 180s at home and she has been feeling extremely thirsty.   ALLERGIES: None.   PAST MEDICAL HISTORY: 1.  Diabetes.  2.  Recent Klebsiella pyelonephritis with bacteremia.  3.  Status post bilateral partial salpingectomy.    SOCIAL  HISTORY: She lives with her husband. She works at [employer] in [employer]. She does not smoke. She does not drink. No history of injecting drug use.   FAMILY HISTORY: Positive for diabetes in her mother.   REVIEW OF SYSTEMS: GENERAL: Positive fevers, chills, sweats and malaise.  HEENT: Some headache. No sinus congestion. No sore throat.  NECK: No stiffness. No swollen glands.  RESPIRATORY:  No cough, no shortness of breath, no sputum production.  CARDIAC: No chest pains or palpitations.  GASTROINTESTINAL: No nausea, no vomiting. Some abdominal pain in the right lower quadrant that has now resolved. No change in her bowels.  GENITOURINARY: She has noted dark tea-colored urine, but no burning or itching. She denies any flank pain. SKIN:  No rashes.  MUSCULOSKELETAL: No joint complaints.  NEUROLOGIC: No focal weakness.  PSYCHIATRIC: No complaints.   All other systems are negative.   PHYSICAL EXAMINATION: VITAL SIGNS: T-max of 102.8, T-current 97.8, pulse 95, blood pressure 118/88, 99% on room air.  GENERAL: A 44 year old Hispanic female in no acute distress.  HEENT: Normocephalic, atraumatic. Pupils are equal, round and reactive to light. Extraocular muscles intact.  Sclerae, conjunctivae and lids without evidence for emboli or petechiae.  Oropharynx shows no evidence of erythema or exudate.  Teeth and gums are in fair condition.   NECK: Supple. Full range of motion.  Midline trachea. No lymphadenopathy.  No thyromegaly.   CHEST:  Clear to auscultation bilaterally with good air movement.  No focal consolidation.  CARDIAC:  Regular rate and rhythm.  No murmur, rub or gallop.   ABDOMEN: Soft, nontender and nondistended. No  hepatosplenomegaly. No hernias noted.  GENITOURINARY: No CVA tenderness.  MUSCULOSKELETAL: No evidence for tenosynovitis.  SKIN:  No rashes.  There were no stigmata of endocarditis,  specifically no Janeway lesions or Osler nodes.  NEUROLOGIC: The patient is awake and  interactive, moving all 4 extremities.  PSYCHIATRIC: Mood and affect appeared normal.   LABORATORY DATA: BUN 4,  creatinine 0.48, bicarbonate 22, anion gap 11. White count 10.0, hemoglobin 12.0, platelet count 233, ANC of 7.5. White count on admission was 15.5. White count at the time of discharge on January 8 was 7.2. Blood cultures are positive for Klebsiella. Urinalysis had greater than 500 glucose, 2+ ketones, 2+ blood, negative nitrites, 3+ leukocyte esterase, 3 red cells and 86 white cells per high-powered field. There were 5 epithelial cells per high-powered field. Urine culture is growing greater than 100,000 CFUs per mL of Klebsiella that is sensitive to all antibiotics except nitrofurantoin and ampicillin. Blood cultures from January 8 are negative. Blood cultures from January 6 were positive for Klebsiella. Her urine culture during her past hospitalization had mixed bacterial flora, suggestive of contamination.   An ultrasound of the pelvis shows some relative endometrial thickening.   A CT scan of the abdomen and pelvis with contrast demonstrates severe enhancement of the upper half of the right kidney concerning for pyelonephritis.   IMPRESSION: A 44 year old female with a history of diabetes and recurrent pyelonephritis with Klebsiella bacteremia.   RECOMMENDATIONS: 1.  She was here a month ago with the same Klebsiella in the blood. While it is possible that she developed a separate unique infection, it is more likely that she recrudesced. I am unclear why she did not improve on Cipro as her isolette was sensitive to fluoroquinolones and the newest culture continues to be sensitive. She states that she took all her medications.  2.  She is currently doing well. We will change her to oral Keflex.  3.  We plan on treating for 3 weeks total. We discussed the need to keep her sugars under control and stay hydrated.  The CT scan shows more extensive involvement of the kidney on the right, but  no obstruction. If she recurs again, would need to reconsider imaging and consider the possibility of necrosis of the kidney. Hopefully, this will not be the case. This is a moderately complex infectious disease consult.   Thank you very much for involving me in the patient's care.       ____________________________ Rosalyn Gess. Artemio Dobie, MD meb:cc D: 08/29/2012 15:46:04 ET T: 08/29/2012 22:38:56 ET JOB#: [id number]  cc: Rosalyn Gess. Layli Capshaw, MD, <Dictator> Zoejane Gaulin E Avenly Roberge MD ELECTRONICALLY SIGNED 08/30/2012 14:40

## 2014-11-16 NOTE — Consult Note (Signed)
PATIENT NAME:  [name], [name] MR#:  [id number] DATE OF BIRTH:  1970-11-08  DATE OF CONSULTATION:  08/29/2012  REFERRING PHYSICIAN:  Milagros LollSrikar Sudini, MD CONSULTING PHYSICIAN:  Suszanne ConnersMichael R. Evelene CroonWolff, MD  REASON FOR CONSULTATION: Pyelonephritis.   HISTORY OF PRESENT ILLNESS: Mrs. [name] is a 44 year old Hispanic female who was admitted to the hospital for febrile UTI versus pneumonia last month. She was readmitted February 1st with fever, chills and right lower quadrant abdominal pain. She had a CT scan February 2nd which was consistent with right pyelonephritis. She denies prior history of recurrent urinary tract infections. She denied any history of kidney stones or prior urological surgery.   ALLERGIES: No known drug allergies.   CURRENT MEDICATIONS: Metformin.   PAST SURGICAL HISTORY: Partial salpingectomy in 2002 for sterilization.   SOCIAL HISTORY: She denied tobacco use or alcohol use.   FAMILY HISTORY: Remarkable for type 2 diabetes.   PAST CURRENT AND MEDICAL CONDITIONS: Type 2 diabetes.   REVIEW OF SYSTEMS: The patient denied history of kidney stones, prior history of recurrent urinary tract infections, prior history of pyelonephritis, urinary incontinence, hematuria or prior urological surgery.   PHYSICAL EXAMINATION: Examination was deferred.   RADIOLOGIC DATA: CT scan report dated February 2nd indicated right pyelonephritis.   Abdominal ultrasound report dated February 1st indicated normal uterus.   PERTINENT LABORATORY STUDIES: Hematocrit 36.7% with white cell count of 10,000.   BUN was 4 and creatinine 0.48.   IMPRESSION: Acute right pyelonephritis.   PLAN: 1. Continue current antibiotics as you are.  2. Urological surgery is not indicated at this time.  3. Discussed urinary tract infection prevention measures and gave her written protocol as well. ____________________________ Suszanne ConnersMichael R. Evelene CroonWolff, MD mrw:sb D: 08/29/2012 10:05:29 ET T: 08/29/2012 11:43:41  ET JOB#: [id number]  cc: Suszanne ConnersMichael R. Evelene CroonWolff, MD, <Dictator> Orson ApeMICHAEL R WOLFF MD ELECTRONICALLY SIGNED 08/29/2012 14:40

## 2014-12-09 IMAGING — US US PELV - US TRANSVAGINAL
1 series · 14 of 25 positions shown · non-contrast
Comparison: none

REASON FOR EXAM: lower abdomin tenderness.
COMMENTS:

PROCEDURE:     US  - US PELVIS EXAM W/TRANSVAGINAL  - August 27, 2012 [DATE]
RESULT:     Comparison: None
TECHNIQUE: Multiple transabdominal gray-scale images and endovaginal
gray-scale images with doppler images of the pelvis performed.

[Series 1: us pelv - us transvaginal · 0.31mm/px · 14 of 76 slices shown]
[im 1/76]
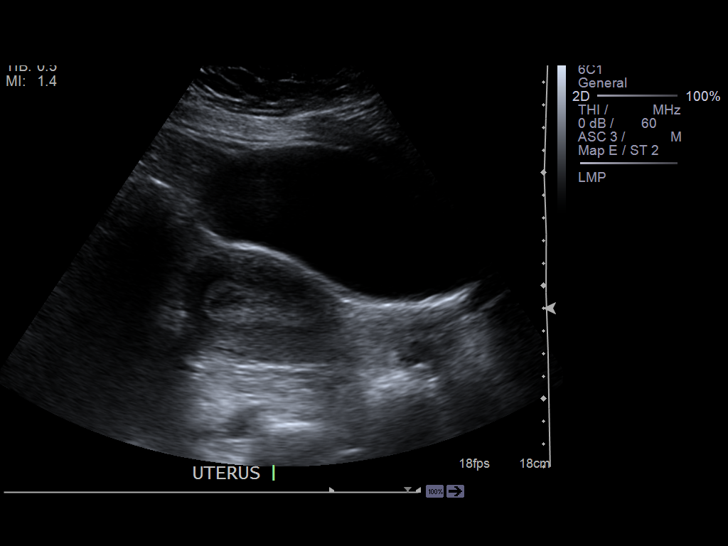
[im 7/76]
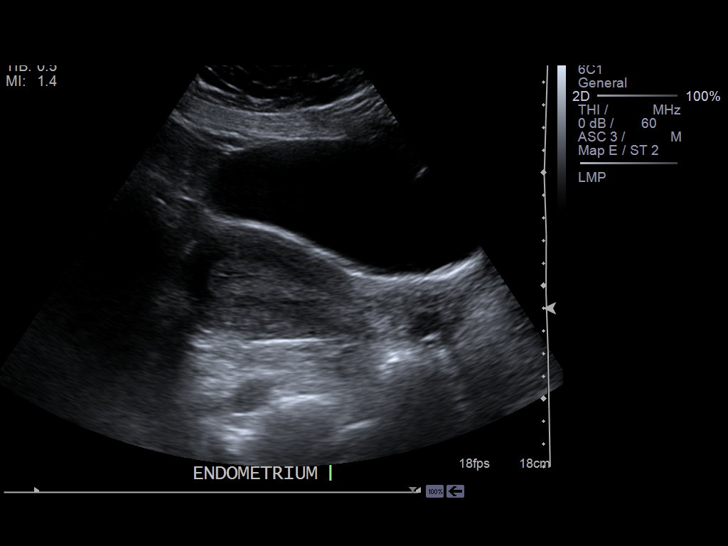
[im 13/76]
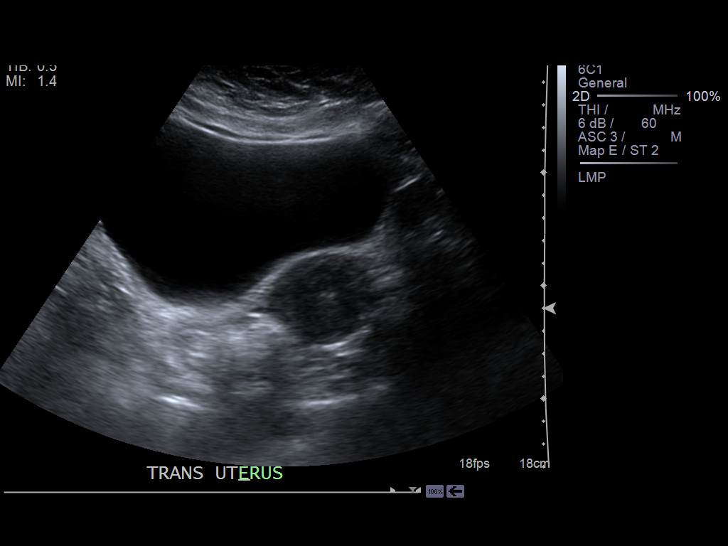
[im 19/76]
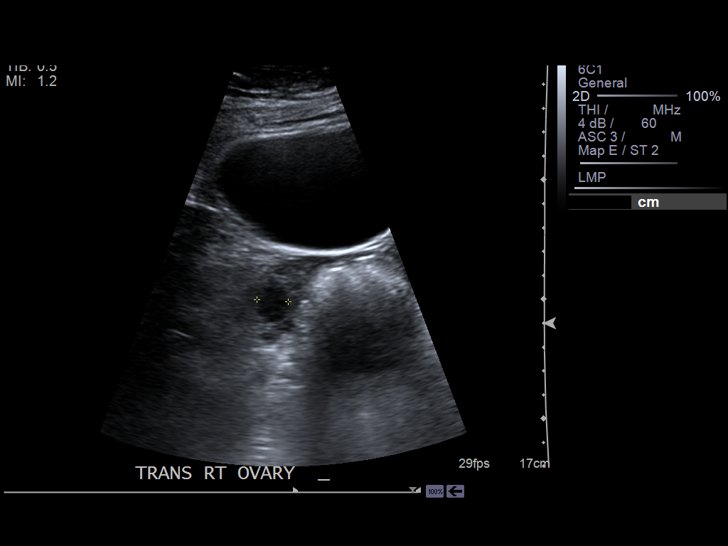
[im 26/76]
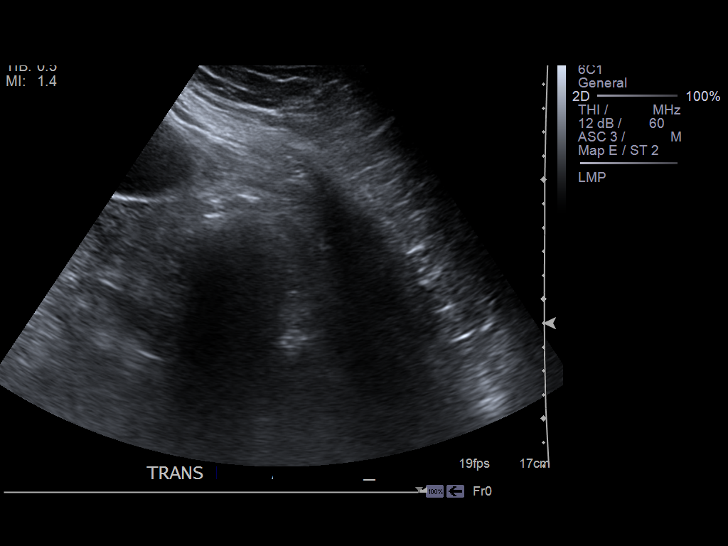
[im 29/76]
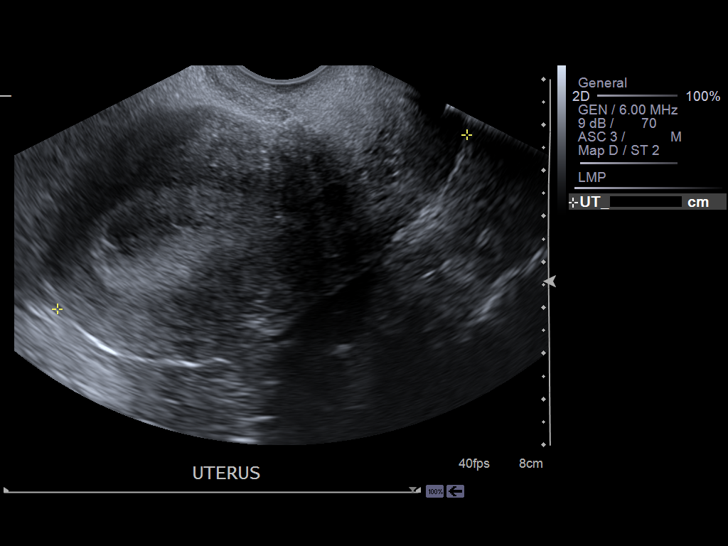
[im 35/76]
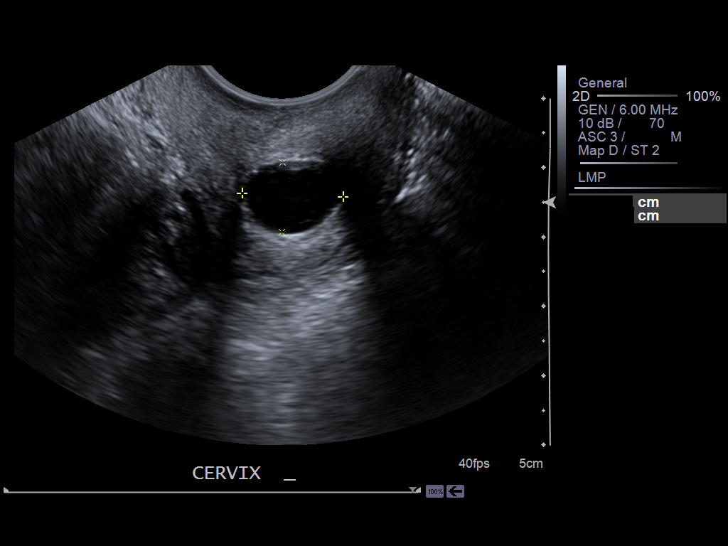
[im 41/76]
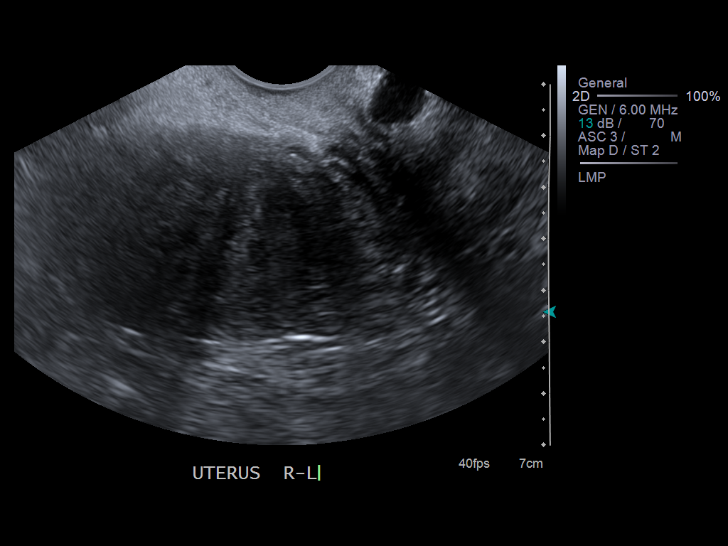
[im 47/76]
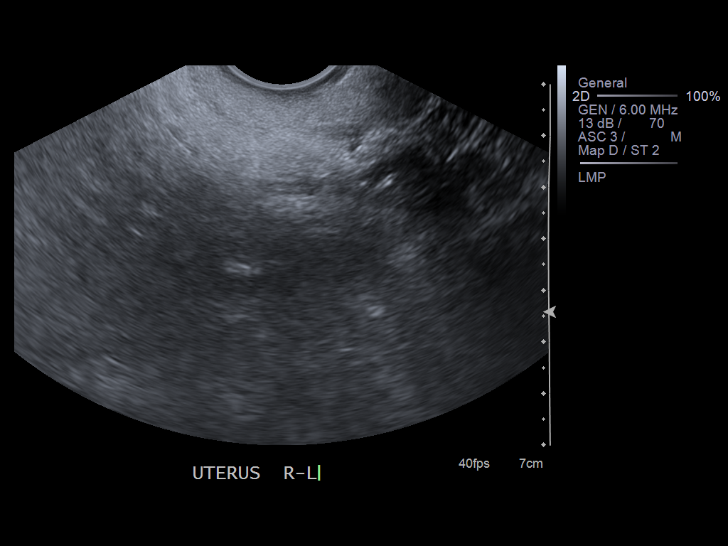
[im 51/76]
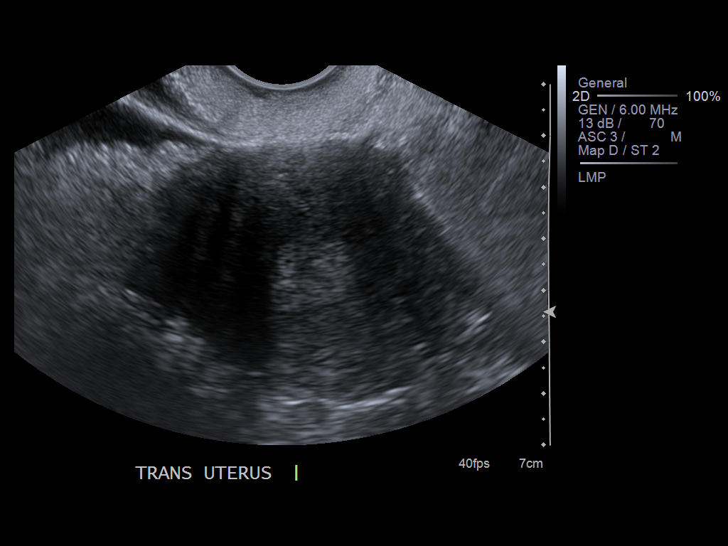
[im 57/76]
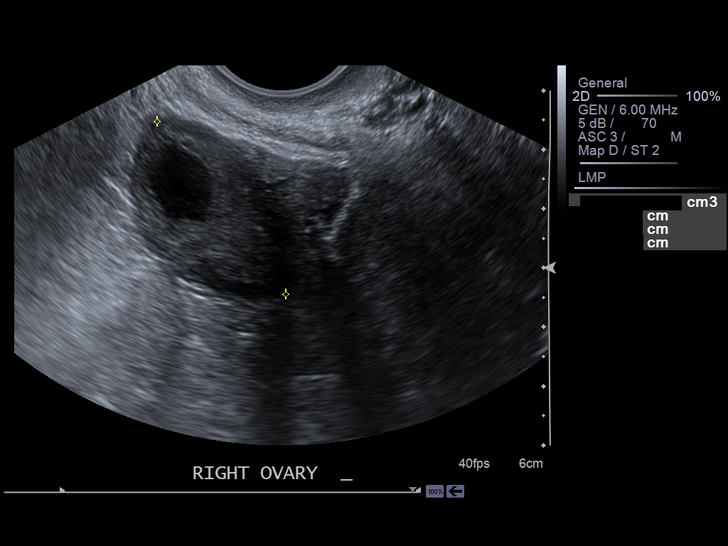
[im 63/76]
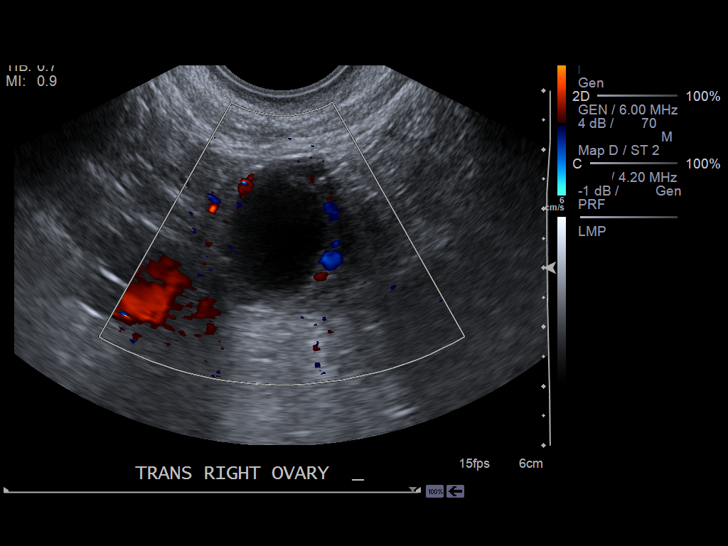
[im 69/76]
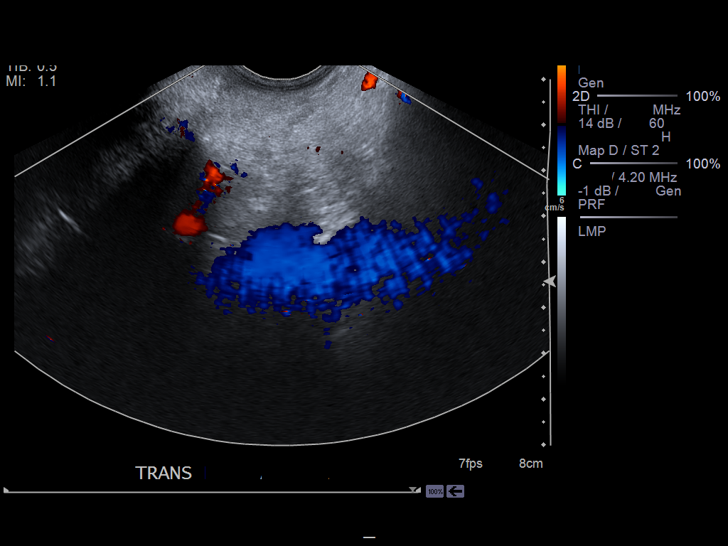
[im 76/76]
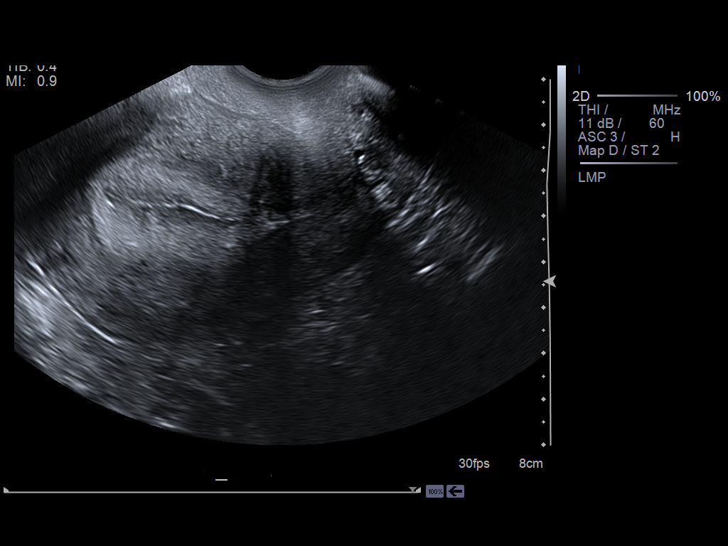

[14 of 25 positions shown; findings below may reference images not displayed]

FINDINGS: The uterus is normal in echotexture measuring 4.6 x 5.7 x 5.3 cm , with
transabdominal ultrasound. The endometrial stripe is uniform and homogeneous
measuring 21.2 mm.  There are no abnormal solid or cystic myometrial mass
lesions noted.

The right ovary measures 3.6 x 2.5 x 3.6 cm.  The left ovary is not
visualized. There is no adnexal mass. There is a dominant right ovarian
follicle.

There is a trace amount of fluid in the cul-de-sac likely physiologic.
IMPRESSION: Relative endometrial thickening which may be within normal limits for the
patient's age and timing of the menstrual cycle. Correlate with the exact
time of the patient's menstrual cycle.

[REDACTED]

## 2014-12-17 ENCOUNTER — Other Ambulatory Visit: Payer: Self-pay | Admitting: Primary Care

## 2014-12-17 DIAGNOSIS — R1031 Right lower quadrant pain: Secondary | ICD-10-CM

## 2014-12-26 ENCOUNTER — Ambulatory Visit
Admission: RE | Admit: 2014-12-26 | Discharge: 2014-12-26 | Disposition: A | Discharge status: Home / Self Care | Payer: 59 | Source: Ambulatory Visit | Attending: Primary Care | Admitting: Primary Care

## 2014-12-26 DIAGNOSIS — R1031 Right lower quadrant pain: Secondary | ICD-10-CM

## 2014-12-26 DIAGNOSIS — R102 Pelvic and perineal pain: Secondary | ICD-10-CM | POA: Diagnosis not present

## 2015-03-06 IMAGING — CR DG CHEST 2V
1 series · 2 of 2 positions shown · non-contrast
Comparison: none

REASON FOR EXAM: Chest Pain
COMMENTS:

PROCEDURE:     DXR - DXR CHEST PA (OR AP) AND LATERAL  - May 05, 2013 [DATE]
RESULT:     The lungs are clear. The heart and pulmonary vessels are normal.
The bony and mediastinal structures are unremarkable. There is no effusion.
There is no pneumothorax or evidence of congestive failure.

[Series 1: w chest pa · 0.14mm/px · 2 of 2 slices shown]
[im 1/2]
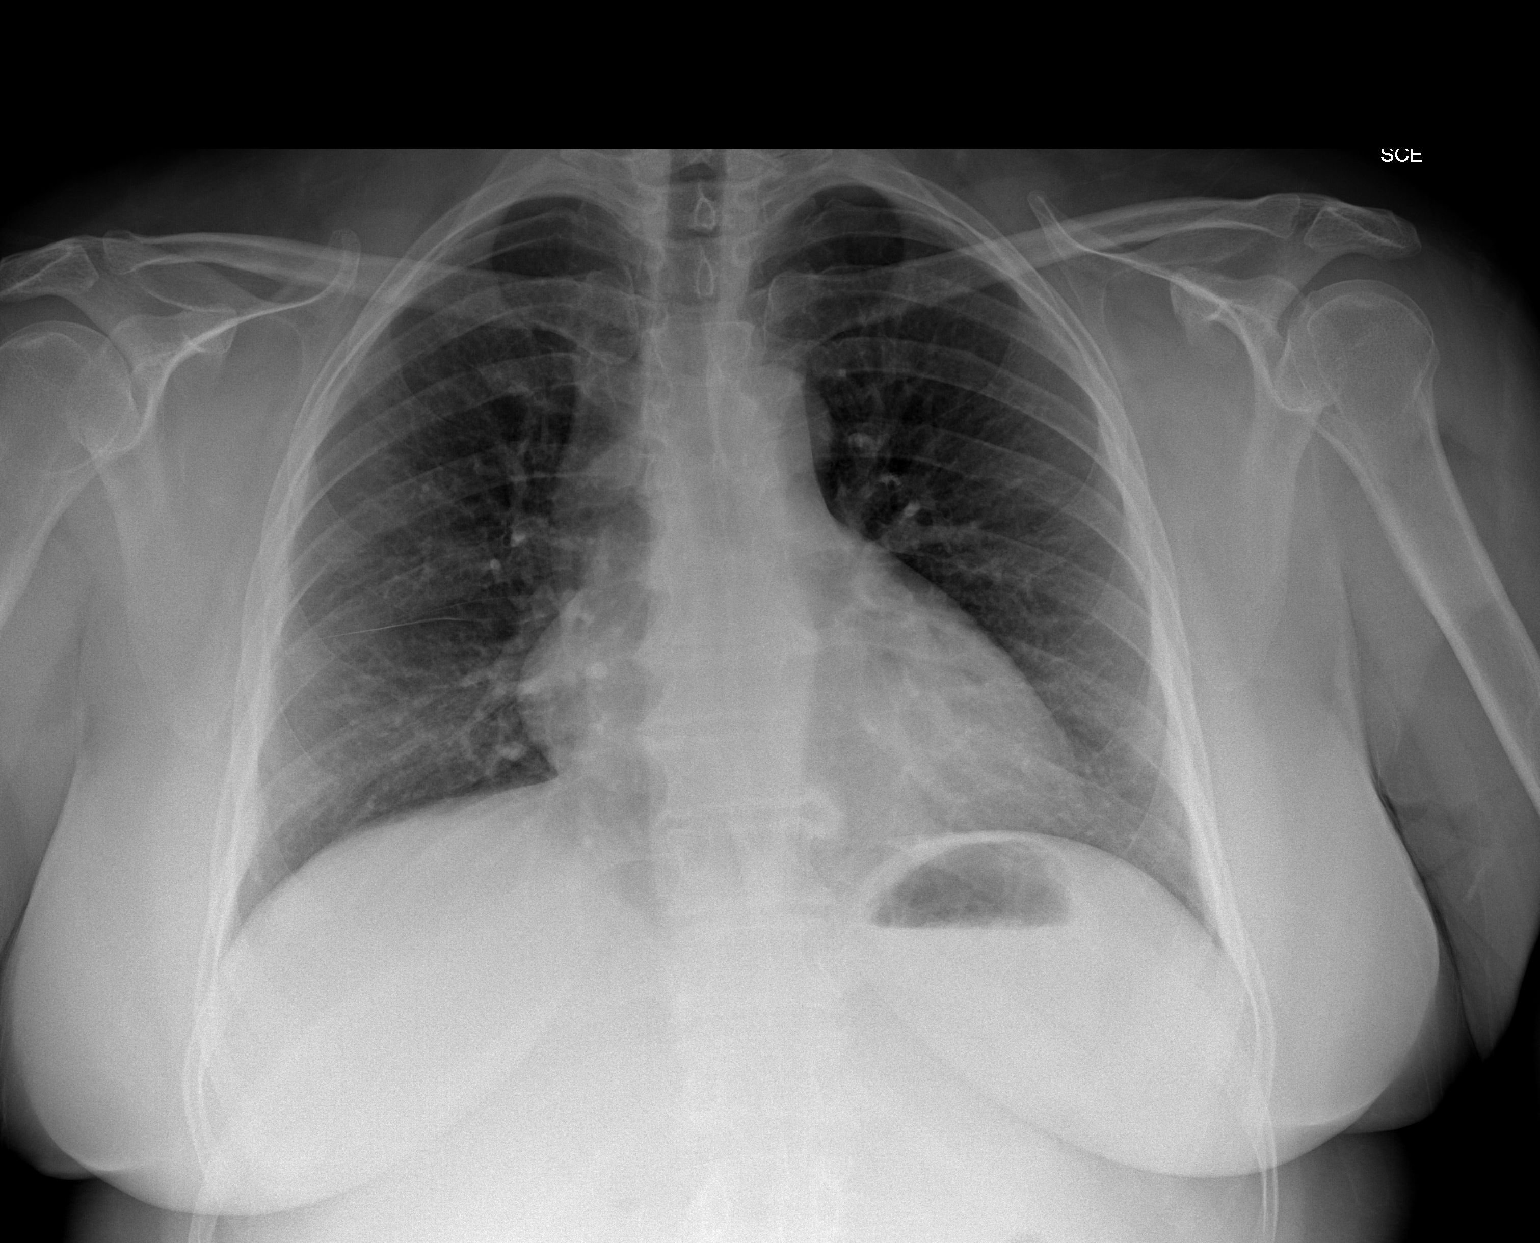
[im 2/2]
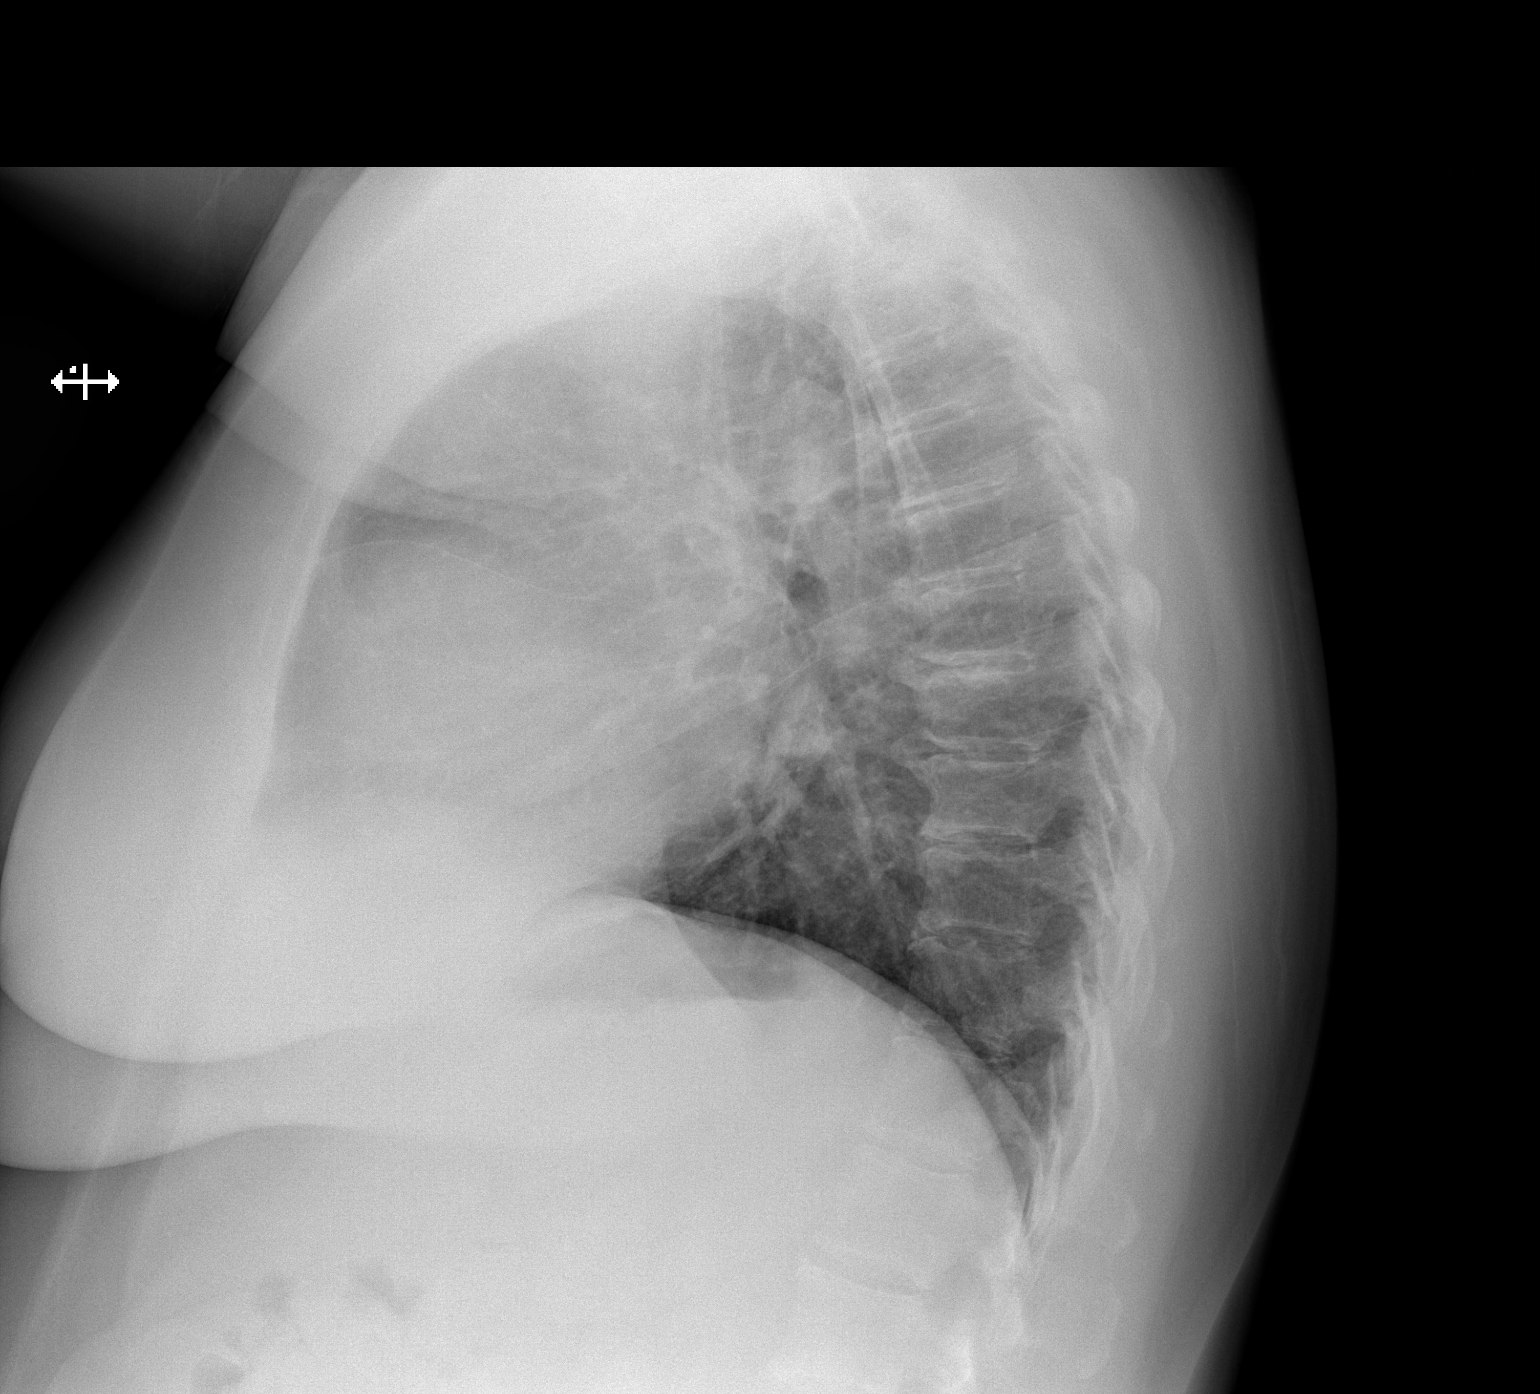

[2 of 2 positions shown; findings below may reference images not displayed]

IMPRESSION: No acute cardiopulmonary disease.

[REDACTED]

## 2015-10-17 DIAGNOSIS — H5213 Myopia, bilateral: Secondary | ICD-10-CM | POA: Diagnosis not present

## 2015-12-26 ENCOUNTER — Other Ambulatory Visit: Payer: Self-pay | Admitting: Primary Care

## 2015-12-26 DIAGNOSIS — Z1329 Encounter for screening for other suspected endocrine disorder: Secondary | ICD-10-CM | POA: Diagnosis not present

## 2015-12-26 DIAGNOSIS — E1165 Type 2 diabetes mellitus with hyperglycemia: Secondary | ICD-10-CM | POA: Diagnosis not present

## 2015-12-26 DIAGNOSIS — Z0000 Encounter for general adult medical examination without abnormal findings: Secondary | ICD-10-CM | POA: Diagnosis not present

## 2015-12-26 DIAGNOSIS — N760 Acute vaginitis: Secondary | ICD-10-CM | POA: Diagnosis not present

## 2015-12-26 DIAGNOSIS — E785 Hyperlipidemia, unspecified: Secondary | ICD-10-CM | POA: Diagnosis not present

## 2015-12-27 ENCOUNTER — Other Ambulatory Visit: Payer: Self-pay | Admitting: Primary Care

## 2015-12-27 DIAGNOSIS — N6019 Diffuse cystic mastopathy of unspecified breast: Secondary | ICD-10-CM

## 2016-09-11 ENCOUNTER — Other Ambulatory Visit: Payer: Self-pay | Admitting: Primary Care

## 2016-09-11 DIAGNOSIS — Z124 Encounter for screening for malignant neoplasm of cervix: Secondary | ICD-10-CM | POA: Diagnosis not present

## 2016-09-11 DIAGNOSIS — E1165 Type 2 diabetes mellitus with hyperglycemia: Secondary | ICD-10-CM | POA: Diagnosis not present

## 2016-09-11 DIAGNOSIS — Z0000 Encounter for general adult medical examination without abnormal findings: Secondary | ICD-10-CM | POA: Diagnosis not present

## 2016-09-11 DIAGNOSIS — Z1231 Encounter for screening mammogram for malignant neoplasm of breast: Secondary | ICD-10-CM

## 2016-09-11 DIAGNOSIS — Z1329 Encounter for screening for other suspected endocrine disorder: Secondary | ICD-10-CM | POA: Diagnosis not present

## 2016-10-15 ENCOUNTER — Other Ambulatory Visit: Payer: Self-pay | Admitting: Primary Care

## 2016-10-15 ENCOUNTER — Ambulatory Visit
Admission: RE | Admit: 2016-10-15 | Discharge: 2016-10-15 | Disposition: A | Discharge status: Home / Self Care | Payer: 59 | Source: Ambulatory Visit | Attending: Primary Care | Admitting: Primary Care

## 2016-10-15 DIAGNOSIS — Z1231 Encounter for screening mammogram for malignant neoplasm of breast: Secondary | ICD-10-CM | POA: Diagnosis not present

## 2016-10-21 ENCOUNTER — Other Ambulatory Visit: Payer: Self-pay | Admitting: Primary Care

## 2016-10-21 DIAGNOSIS — R928 Other abnormal and inconclusive findings on diagnostic imaging of breast: Secondary | ICD-10-CM

## 2016-10-21 DIAGNOSIS — N6310 Unspecified lump in the right breast, unspecified quadrant: Secondary | ICD-10-CM

## 2016-10-21 DIAGNOSIS — R590 Localized enlarged lymph nodes: Secondary | ICD-10-CM

## 2017-03-10 ENCOUNTER — Ambulatory Visit: Payer: Self-pay | Admitting: Physician Assistant

## 2017-03-10 ENCOUNTER — Encounter: Payer: Self-pay | Admitting: Physician Assistant

## 2017-03-10 VITALS — BP 110/80 | HR 78 | Temp 98.3°F

## 2017-03-10 DIAGNOSIS — J029 Acute pharyngitis, unspecified: Secondary | ICD-10-CM

## 2017-03-10 MED ORDER — MAGIC MOUTHWASH W/LIDOCAINE: 5.0000 mL | Freq: Four times a day (QID) | ORAL | 0 refills | Status: AC

## 2017-03-10 NOTE — Progress Notes (Signed)
   Subjective:Sore throat    Patient ID: [name], female    DOB: 06-Mar-1971, 46 y.o.   MRN: [id number]  HPI Patient c/o sore throat for two days. Denies othe URI sign/symptoms. Rates pain as 5/10 and describes as"Sore/scratching". No palliative measure for compliant.   Review of Systems     Diabetes Objective:   Physical Exam HEENT remarkable for erythematous pharynx w/o exudative tonsils. Neck supple w/o adenopathy. Lungs CTA and Heart RRR.       Assessment & Plan:Viral pharyngitis  Advised supportive care and Magic Mouthwash. Follow up 3 days is no improvement or worsen of compliant.

## 2017-07-01 DIAGNOSIS — H5203 Hypermetropia, bilateral: Secondary | ICD-10-CM | POA: Diagnosis not present

## 2018-01-20 ENCOUNTER — Other Ambulatory Visit: Payer: Self-pay | Admitting: Primary Care

## 2018-01-20 DIAGNOSIS — Z1329 Encounter for screening for other suspected endocrine disorder: Secondary | ICD-10-CM | POA: Diagnosis not present

## 2018-01-20 DIAGNOSIS — E1165 Type 2 diabetes mellitus with hyperglycemia: Secondary | ICD-10-CM | POA: Diagnosis not present

## 2018-01-20 DIAGNOSIS — R928 Other abnormal and inconclusive findings on diagnostic imaging of breast: Secondary | ICD-10-CM

## 2018-01-20 DIAGNOSIS — N760 Acute vaginitis: Secondary | ICD-10-CM | POA: Diagnosis not present

## 2018-01-20 DIAGNOSIS — S81809A Unspecified open wound, unspecified lower leg, initial encounter: Secondary | ICD-10-CM | POA: Diagnosis not present

## 2018-01-20 DIAGNOSIS — Z0000 Encounter for general adult medical examination without abnormal findings: Secondary | ICD-10-CM | POA: Diagnosis not present

## 2018-02-02 ENCOUNTER — Encounter: Payer: 59 | Attending: Internal Medicine | Admitting: Internal Medicine

## 2018-02-02 DIAGNOSIS — E1142 Type 2 diabetes mellitus with diabetic polyneuropathy: Secondary | ICD-10-CM | POA: Diagnosis not present

## 2018-02-02 DIAGNOSIS — L97221 Non-pressure chronic ulcer of left calf limited to breakdown of skin: Secondary | ICD-10-CM | POA: Diagnosis not present

## 2018-02-02 DIAGNOSIS — E11622 Type 2 diabetes mellitus with other skin ulcer: Secondary | ICD-10-CM | POA: Insufficient documentation

## 2018-02-02 DIAGNOSIS — I87311 Chronic venous hypertension (idiopathic) with ulcer of right lower extremity: Secondary | ICD-10-CM | POA: Diagnosis not present

## 2018-02-02 DIAGNOSIS — Z7984 Long term (current) use of oral hypoglycemic drugs: Secondary | ICD-10-CM | POA: Diagnosis not present

## 2018-02-02 DIAGNOSIS — L97222 Non-pressure chronic ulcer of left calf with fat layer exposed: Secondary | ICD-10-CM | POA: Diagnosis not present

## 2018-02-02 DIAGNOSIS — I87312 Chronic venous hypertension (idiopathic) with ulcer of left lower extremity: Secondary | ICD-10-CM | POA: Diagnosis not present

## 2018-02-03 NOTE — Progress Notes (Signed)
[place], [name] ([id number]) Visit Report for 02/02/2018 Chief Complaint Document Details Patient Name: [name], KOWALESKI Date of Service: 02/02/2018 8:00 AM Medical Record Number: [id number] Patient Account Number: [id number] Date of Birth/Sex: 08/05/1970 (47 y.o. Female) Treating RN: Renne Crigler Primary Care Provider: Sandrea Hughs Other Clinician: Referring Provider: Sandrea Hughs Treating Provider/Extender: Maxwell Caul Weeks in Treatment: 0 Information Obtained from: Patient Chief Complaint 02/02/18; patient is here for review of wound on her left lateral calf distally Electronic Signature(s) Signed: 02/02/2018 6:13:38 PM By: Baltazar Najjar MD Entered By: Baltazar Najjar on 02/02/2018 09:44:50 Haller, Kristia ([id number]) -------------------------------------------------------------------------------- Debridement Details Patient Name: [name] Date of Service: 02/02/2018 8:00 AM Medical Record Number: [id number] Patient Account Number: [id number] Date of Birth/Sex: February 17, 1971 (47 y.o. Female) Treating RN: Renne Crigler Primary Care Provider: Sandrea Hughs Other Clinician: Referring Provider: Sandrea Hughs Treating Provider/Extender: Maxwell Caul Weeks in Treatment: 0 Debridement Performed for Wound #1 Left,Lateral Lower Leg Assessment: Performed By: Physician Maxwell Caul, MD Debridement Type: Debridement Severity of Tissue Pre Fat layer exposed Debridement: Pre-procedure Verification/Time Yes - 09:19 Out Taken: Start Time: 09:19 Pain Control: Other : lidocaine 4% Total Area Debrided (L x W): 2.3 (cm) x 1.5 (cm) = 3.45 (cm) Tissue and other material Viable, Slough, Subcutaneous, Biofilm, Slough debrided: Level: Skin/Subcutaneous Tissue Debridement Description: Excisional Instrument: Curette Bleeding: Moderate Hemostasis Achieved: Pressure End Time: 09:21 Procedural Pain: 0 Post Procedural Pain: 0 Response to Treatment: Procedure was tolerated  well Level of Consciousness: Awake and Alert Post Debridement Measurements of Total Wound Length: (cm) 2.3 Width: (cm) 1.5 Depth: (cm) 0.2 Volume: (cm) 0.542 Character of Wound/Ulcer Post Debridement: Stable Severity of Tissue Post Debridement: Fat layer exposed Post Procedure Diagnosis Same as Pre-procedure Electronic Signature(s) Signed: 02/02/2018 5:14:04 PM By: Renne Crigler Signed: 02/02/2018 6:13:38 PM By: Baltazar Najjar MD Entered By: Baltazar Najjar on 02/02/2018 09:44:23 Hlavacek, Polina ([id number]) -------------------------------------------------------------------------------- HPI Details Patient Name: [name] Date of Service: 02/02/2018 8:00 AM Medical Record Number: [id number] Patient Account Number: [id number] Date of Birth/Sex: 08/29/1970 (47 y.o. Female) Treating RN: Renne Crigler Primary Care Provider: Sandrea Hughs Other Clinician: Referring Provider: Sandrea Hughs Treating Provider/Extender: Maxwell Caul Weeks in Treatment: 0 History of Present Illness HPI Description: ADMISSION 02/02/18 This is a 47 year old woman who is a poorly controlled type 2 diabetes with a recent hemoglobin A1c at the end of June/19 of 13.9. She's had medication adjustments made by her primary physician. I think her compliance with insulin has been poor. She tells me that 2 weeks ago she noticed a burning or stinging sensation on the left lateral leg. She thought something had better and then the area began itching. Her doctor prescribes some form of topical cream. She was also started on cephalexin and perhaps miconazole by her primary physician on July 9. She does not describe claudication. She is not a smoker. She does not have a wound history ABIs in our clinic were 0.9 on the right and 1.09 on the left Electronic Signature(s) Signed: 02/02/2018 6:13:38 PM By: Baltazar Najjar MD Entered By: Baltazar Najjar on 02/02/2018 09:46:51 Follett, Neveah  ([id number]) -------------------------------------------------------------------------------- Physical Exam Details Patient Name: [name] Date of Service: 02/02/2018 8:00 AM Medical Record Number: [id number] Patient Account Number: [id number] Date of Birth/Sex: Nov 10, 1970 (47 y.o. Female) Treating RN: Renne Crigler Primary Care Provider: Sandrea Hughs Other Clinician: Referring Provider: Sandrea Hughs Treating Provider/Extender: Maxwell Caul Weeks in Treatment: 0 Constitutional Sitting or standing Blood Pressure is within target range for patient.. Pulse regular and within target  range for patient.Marland Kitchen Respirations regular, non-labored and within target range.. Temperature is normal and within the target range for the patient.Marland Kitchen appears in no distress. Respiratory Respiratory effort is easy and symmetric bilaterally. Rate is normal at rest and on room air.. Bilateral breath sounds are clear and equal in all lobes with no wheezes, rales or rhonchi.. Cardiovascular Heart rhythm and rate regular, without murmur or gallop.. Femoral arteries without bruits and pulses strong.. Pedal pulses palpable and strong bilaterally.. she has varicosities in the leg bilaterally especially in the medial aspect. Some edema but is nonpitting. Gastrointestinal (GI) no liverspleen no tenderness. Lymphatic nonpalpable no popliteal or inguinal area. Neurological she has mild loss of vibration sensation compared to her hands. Psychiatric No evidence of depression, anxiety, or agitation. Calm, cooperative, and communicative. Appropriate interactions and affect.. Notes wound exam; the area is on the left lateral calf about the size of a quarter. Covered in tightly adherent necrotic surface debris. Using a #5 curet I debrided the area of necrotic subcutaneous tissue however I could not get to a viable wound surface. The patient could not tolerate it due to pain. There is some mild surrounding erythema  but I don't think this represents cellulitis nevertheless she will continue the antibiotics prescribed by her primary physician. No cultures were done Electronic Signature(s) Signed: 02/02/2018 6:13:38 PM By: Baltazar Najjar MD Entered By: Baltazar Najjar on 02/02/2018 09:48:53 Leather, Dailynn ([id number]) -------------------------------------------------------------------------------- Physician Orders Details Patient Name: [name] Date of Service: 02/02/2018 8:00 AM Medical Record Number: [id number] Patient Account Number: [id number] Date of Birth/Sex: 11-Feb-1971 (47 y.o. Female) Treating RN: Renne Crigler Primary Care Provider: Sandrea Hughs Other Clinician: Referring Provider: Sandrea Hughs Treating Provider/Extender: Altamese [place] in Treatment: 0 Verbal / Phone Orders: No Diagnosis Coding Wound Cleansing Wound #1 Left,Lateral Lower Leg o Clean wound with Normal Saline. o May shower with protection. Anesthetic (add to Medication List) Wound #1 Left,Lateral Lower Leg o Topical Lidocaine 4% cream applied to wound bed prior to debridement (In Clinic Only). Primary Wound Dressing Wound #1 Left,Lateral Lower Leg o Iodoflex Secondary Dressing Wound #1 Left,Lateral Lower Leg o ABD pad Dressing Change Frequency Wound #1 Left,Lateral Lower Leg o Change dressing every week Follow-up Appointments Wound #1 Left,Lateral Lower Leg o Return Appointment in 1 week. o Nurse Visit as needed Edema Control Wound #1 Left,Lateral Lower Leg o 3 Layer Compression System - Left Lower Extremity Electronic Signature(s) Signed: 02/02/2018 5:14:04 PM By: Renne Crigler Signed: 02/02/2018 6:13:38 PM By: Baltazar Najjar MD Entered By: Renne Crigler on 02/02/2018 09:25:54 Crosson, Jullian ([id number]) -------------------------------------------------------------------------------- Problem List Details Patient Name: [name] Date of Service: 02/02/2018 8:00  AM Medical Record Number: [id number] Patient Account Number: [id number] Date of Birth/Sex: 05/10/1971 (47 y.o. 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 Tightly adherent necrotic surface requiring leave under 3K Decision debridement. No healing will be possible until the surface impression all week Making : is better I87.311 Chronic venous hypertension (idiopathic) with ulcer of 02/02/2018 Yes Yes right lower extremity Status Complications Interventions Other new patient. Patient has chronic varicosities and some she is going to need Medical edema in both legs.  some compression Decision probably 3K compression Making : today E11.622 Type 2 diabetes mellitus with other skin ulcer 02/02/2018 Yes Yes Status Complications Interventions Risk to patient had a recent hemoglobin A1c of 13.9. It would the patient is seen her Medical limb appear that this is chronically uncontrolled primary doctor. Decision Adjustments were made Making : to medications on insulin/metformin/glipizide E11.42 Type 2 diabetes mellitus with diabetic polyneuropathy 02/02/2018 No Yes Inactive Problems Resolved Problems Electronic Signature(s) Signed: 02/02/2018 6:13:38 PM By: Baltazar Najjar MD Entered By: Baltazar Najjar on 02/02/2018 09:43:31 Liguori, Aime ([id number]) Hyndman, Abrina ([id number]) -------------------------------------------------------------------------------- Progress Note Details Patient Name: [name] Date of Service:  02/02/2018 8:00 AM Medical Record Number: [id number] Patient Account Number: [id number] Date of Birth/Sex: August 12, 1970 (47 y.o. Female) Treating RN: Renne Crigler Primary Care Provider: Sandrea Hughs Other Clinician: Referring Provider: Sandrea Hughs Treating Provider/Extender: Altamese [place] in Treatment: 0 Subjective Chief Complaint Information obtained from Patient 02/02/18; patient is here for review of wound on her left lateral calf distally History of Present Illness (HPI) ADMISSION 02/02/18 This is a 47 year old woman who is a poorly controlled type 2 diabetes with a recent hemoglobin A1c at the end of June/19 of 13.9. She's had medication adjustments made by her primary physician. I think her compliance with insulin has been poor. She tells me that 2 weeks ago she noticed a burning or stinging sensation on the left lateral leg. She thought something had better and then the area began itching. Her doctor prescribes some form of topical cream. She was also started on cephalexin and perhaps miconazole by her primary physician on July 9. She does not describe claudication. She is not a smoker. She does not have a wound history ABIs in our clinic were 0.9 on the right and 1.09 on the left Wound History Patient presents with 1 open wound that has been present for approximately 2 weeks. Patient has been treating wound in the following manner: aloe. Laboratory tests have not been performed in the last month. Patient reportedly has not tested positive for an antibiotic resistant organism. Patient reportedly has not tested positive for osteomyelitis. Patient reportedly has not had testing performed to evaluate circulation in the legs. Patient History Information obtained from Patient. Allergies NKDA Family History Diabetes - Mother,Maternal Grandparents, Hypertension - Siblings, No family history of Cancer, Heart Disease, Hereditary Spherocytosis, Kidney Disease, Lung Disease,  Seizures, Stroke, Thyroid Problems, Tuberculosis. Social History Never smoker, Marital Status - Single, Alcohol Use - Rarely, Drug Use - No History, Caffeine Use - Daily. Medical History Endocrine Patient has history of Type II Diabetes Denies history of Type I Diabetes Patient is treated with Insulin, Oral Agents. Blood sugar is tested. [place], [place]oby ([id number]) Review of Systems (ROS) Constitutional Symptoms (General Health) The patient has no complaints or symptoms. Eyes Complains or has symptoms of Glasses / Contacts. Ear/Nose/Mouth/Throat The patient has no complaints or symptoms. Hematologic/Lymphatic The patient has no complaints or symptoms. Respiratory The patient has no complaints or symptoms. Cardiovascular The patient has no complaints or symptoms. Gastrointestinal The patient has no complaints or symptoms. Endocrine Denies complaints or symptoms of Hepatitis, Thyroid disease. Genitourinary The patient has no complaints or symptoms. Immunological The patient has no complaints or symptoms. Integumentary (Skin) Complains or has symptoms of Wounds. Musculoskeletal The patient has no complaints or symptoms. Neurologic The patient has no complaints or symptoms. Oncologic The patient has no complaints or symptoms. Psychiatric The patient has no complaints or symptoms. Objective Constitutional Sitting or standing Blood Pressure  is within target range for patient.. Pulse regular and within target range for patient.Marland Kitchen Respirations regular, non-labored and within target range.. Temperature is normal and within the target range for the patient.Marland Kitchen appears in no distress. Vitals Time Taken: 8:23 AM, Height: 65 in, Source: Stated, Weight: 166.8 lbs, Source: Measured, BMI: 27.8, Temperature: 98.0 F, Pulse: 89 bpm, Respiratory Rate: 18 breaths/min, Blood Pressure: 104/69 mmHg. Respiratory Respiratory effort is easy and symmetric bilaterally. Rate is normal at rest and on  room air.. Bilateral breath sounds are clear and equal in all lobes with no wheezes, rales or rhonchi.. Cardiovascular Heart rhythm and rate regular, without murmur or gallop.. Femoral arteries without bruits and pulses strong.. Pedal pulses palpable and strong bilaterally.. she has varicosities in the leg bilaterally especially in the medial aspect. Some edema but is nonpitting. [place], [place] ([id number]) Gastrointestinal (GI) no liverspleen no tenderness. Lymphatic nonpalpable no popliteal or inguinal area. Neurological she has mild loss of vibration sensation compared to her hands. Psychiatric No evidence of depression, anxiety, or agitation. Calm, cooperative, and communicative. Appropriate interactions and affect.. General Notes: wound exam; the area is on the left lateral calf about the size of a quarter. Covered in tightly adherent necrotic surface debris. Using a #5 curet I debrided the area of necrotic subcutaneous tissue however I could not get to a viable wound surface. The patient could not tolerate it due to pain. There is some mild surrounding erythema but I don't think this represents cellulitis nevertheless she will continue the antibiotics prescribed by her primary physician. No cultures were done Integumentary (Hair, Skin) Wound #1 status is Open. Original cause of wound was Gradually Appeared. The wound is located on the Left,Lateral Lower Leg. The wound measures 2.3cm length x 1.5cm width x 0.1cm depth; 2.71cm^2 area and 0.271cm^3 volume. There is no tunneling or undermining noted. There is a medium amount of serosanguineous drainage noted. The wound margin is flat and intact. There is no granulation within the wound bed. There is a large (67-100%) amount of necrotic tissue within the wound bed including Eschar and Adherent Slough. The periwound skin appearance exhibited: Erythema. The surrounding wound skin color is noted with erythema which is  circumferential. 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 There was a Excisional Skin/Subcutaneous Tissue Debridement with a total area of 3.45 sq cm performed by Maxwell Caul, MD. With the following instrument(s): Curette to remove Viable tissue/material. Material removed includes Subcutaneous Tissue, Slough, and Biofilm after achieving pain control using Other (lidocaine 4%). No specimens were taken. A time out was conducted at 09:19, prior to the start of the procedure. A Moderate amount of bleeding was controlled with Pressure. The procedure was tolerated well with a pain level of 0 throughout and a pain level of 0 following the procedure. Patient s Level of Consciousness post procedure was recorded as Awake and Alert. Post Debridement Measurements: 2.3cm length x 1.5cm width x 0.2cm depth; 0.542cm^3 volume. Character of Wound/Ulcer Post Debridement is stable. Severity of Tissue Post Debridement is: Fat layer exposed. Post procedure Diagnosis Wound #1: Same as Pre-Procedure [place], [place] ([id number]) Plan Wound Cleansing: Wound #1 Left,Lateral Lower Leg:  Clean wound with Normal Saline. May shower with protection. Anesthetic (add to Medication List): Wound #1 Left,Lateral Lower Leg: Topical Lidocaine 4% cream applied to wound bed prior to debridement (In Clinic Only). Primary Wound Dressing: Wound #1 Left,Lateral Lower Leg: Iodoflex Secondary Dressing: Wound #1 Left,Lateral Lower Leg: ABD pad Dressing Change Frequency: Wound #1 Left,Lateral Lower Leg: Change dressing  every week Follow-up Appointments: Wound #1 Left,Lateral Lower Leg: Return Appointment in 1 week. Nurse Visit as needed Edema Control: Wound #1 Left,Lateral Lower Leg: 3 Layer Compression System - Left Lower Extremity Medical Decision Making Non-pressure chronic ulcer of left calf limited to breakdown of skin 02/02/2018 Status: Other Complications: new wound; area is on the left lateral leg small circular wound. Tightly adherent necrotic surface requiring debridement. No healing will be possible until the surface is better Interventions: debridement Iodoflex leave under 3K impression all week Chronic venous hypertension (idiopathic) with ulcer of right lower extremity 02/02/2018 Status: Other Complications: new patient. Patient has chronic varicosities and some edema in both legs. Interventions: she is going to need some compression probably 3K compression today Type 2 diabetes mellitus with other skin ulcer 02/02/2018 Status: Risk to limb Complications: patient had a recent hemoglobin A1c of 13.9. It would appear that this is chronically uncontrolled Interventions: the patient is seen her primary doctor. Adjustments were made to medications on insulin/metformin/glipizide #1 the patient does not have insurance that will pay for wound care products therefore I put Iodoflex on the wound under 3L compression. This will help with further debridement. 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 For now we'll see how this goes. Certainly if this worsens then that may be reasonable. #3 the patient has very poorly controlled diabetes but seems to be motivated now to improve this [facility]. [place] ParkGARCIA, Amayah ([id number]) #4 if this does not heal she may need venous reflux studies however she has not had a prior history of wounds and has not worn stockings. Stockings may also be  necessary Electronic Signature(s) Signed: 02/02/2018 6:13:38 PM By: Baltazar Najjarobson, Alizah Sills MD Entered By: Baltazar Najjarobson, Jevante Hollibaugh on 02/02/2018 09:51:16 Jamroz, Tyechia ([id number]) -------------------------------------------------------------------------------- ROS/PFSH Details Patient Name: [name] MassyGARCIA, Sinahi Date of Service: 02/02/2018 8:00 AM Medical Record Number: [id number] Patient Account Number: [id number] Date of Birth/Sex: 1971/01/26 (47 y.o. Female) Treating RN: Ashok CordiaPinkerton, Debi Primary Care Provider: Sandrea HughsUBIO, JESSICA Other Clinician: Referring Provider: Sandrea HughsUBIO, JESSICA Treating Provider/Extender: Maxwell CaulOBSON, Sydney Azure G Weeks in Treatment: 0 Information Obtained From Patient Wound History Do you currently have one or more open woundso Yes How many open wounds do you currently haveo 1 Approximately how long have you had your woundso 2 weeks How have you been treating your wound(s) until nowo aloe Has your wound(s) ever healed and then re-openedo No Have you had any lab work done in the past montho No Have you tested positive for an antibiotic resistant organism (MRSA, VRE)o No Have you tested positive for osteomyelitis (bone infection)o No Have you had any tests for circulation on your legso No Eyes Complaints and Symptoms: Positive for: Glasses / Contacts Endocrine Complaints and Symptoms: Negative for: Hepatitis; Thyroid disease Medical History: Positive for: Type II Diabetes Negative for: Type I Diabetes Time with diabetes: 20 yrs started at pregnancy Treated with: Insulin, Oral agents Blood sugar tested every day: Yes Tested : Integumentary (Skin) Complaints and Symptoms: Positive for: Wounds Constitutional Symptoms (General Health) Complaints and Symptoms: No Complaints or  Symptoms Ear/Nose/Mouth/Throat Complaints and Symptoms: No Complaints or Symptoms Hematologic/Lymphatic [place], Khiana ([id number]) Complaints and Symptoms: No Complaints or Symptoms Respiratory Complaints and  Symptoms: No Complaints or Symptoms Cardiovascular Complaints and Symptoms: No Complaints or Symptoms Gastrointestinal Complaints and Symptoms: No Complaints or Symptoms Genitourinary Complaints and Symptoms: No Complaints or Symptoms Immunological Complaints and Symptoms: No Complaints or Symptoms Musculoskeletal Complaints and Symptoms: No Complaints or Symptoms Neurologic Complaints and Symptoms: No Complaints or Symptoms Oncologic Complaints and Symptoms: No Complaints or Symptoms Psychiatric Complaints and Symptoms: No Complaints or Symptoms Immunizations Pneumococcal Vaccine: Received Pneumococcal Vaccination: No Implantable Devices Family and Social History Cancer: No; Diabetes: Yes - Mother,Maternal Grandparents; Heart Disease: No; Hereditary Spherocytosis: No; Hypertension: Yes - Siblings; Kidney Disease: No; Lung Disease: No; Seizures: No; Stroke: No; Thyroid Problems: No; Tuberculosis: No; Never smoker; Marital Status - Single; Alcohol Use: Rarely; Drug Use: No History; Caffeine Use: Daily; Financial Concerns: No; Food, Clothing or Shelter Needs: No; Support System Lacking: No; Transportation Concerns: No; Smith, Latessa ([id number]) Advanced Directives: No; Patient does not want information on Advanced Directives; Do not resuscitate: No; Living Will: No; Medical Power of Attorney: No Electronic Signature(s) Signed: 02/02/2018 5:25:23 PM By: Alejandro Mulling Signed: 02/02/2018 6:13:38 PM By: Baltazar Najjar MD Entered By: Alejandro Mulling on 02/02/2018 08:36:23 Schifano, Ayari ([id number]) -------------------------------------------------------------------------------- SuperBill Details Patient Name: [name] Date of Service: 02/02/2018 Medical Record Number: [id number] Patient Account Number: [id number] Date of Birth/Sex: 1971/02/25 (47 y.o. Female) Treating RN: Renne Crigler Primary Care Provider: Sandrea Hughs Other Clinician: Referring Provider: Sandrea Hughs Treating Provider/Extender: Maxwell Caul Weeks in Treatment: 0 Diagnosis Coding ICD-10 Codes Code Description L97.221 Non-pressure chronic ulcer of left calf limited to breakdown of skin I87.311 Chronic venous hypertension (idiopathic) with ulcer of right lower extremity E11.622 Type 2 diabetes mellitus with other skin ulcer E11.42 Type 2 diabetes mellitus with diabetic polyneuropathy Facility Procedures CPT4 Code: 62952841 Description: 99213 - WOUND CARE VISIT-LEV 3 EST PT Modifier: Quantity: 1 CPT4 Code: 32440102 Description: 11042 - DEB SUBQ TISSUE 20 SQ CM/< ICD-10 Diagnosis Description L97.221 Non-pressure chronic ulcer of left calf limited to breakdown Modifier: of skin Quantity: 1 Physician Procedures CPT4 Code: 7253664 Description: WC PHYS LEVEL 3 o NEW PT ICD-10 Diagnosis Description L97.221 Non-pressure chronic ulcer of left calf limited to breakdown o I87.311 Chronic venous hypertension (idiopathic) with ulcer of right l E11.622 Type 2 diabetes mellitus with other  skin ulcer Modifier: 25 f skin ower extremity Quantity: 1 CPT4 Code: 4034742 Description: 11042 - WC PHYS SUBQ TISS 20 SQ CM ICD-10 Diagnosis Description 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

## 2018-02-03 NOTE — Progress Notes (Signed)
[place], [name] ([id number]) Visit Report for 02/02/2018 Allergy List Details Patient Name: [name], [name] Date of Service: 02/02/2018 8:00 AM Medical Record Number: [id number] Patient Account Number: [id number] Date of Birth/Sex: [birth year] (47 y.o. Female) Treating RN: Ashok Cordia, Debi Primary Care Fujiko Picazo: Sandrea Hughs Other Clinician: Referring Adriahna Shearman: Sandrea Hughs Treating Linnette Panella/Extender: Maxwell Caul Weeks in Treatment: 0 Allergies Active Allergies NKDA Allergy Notes Electronic Signature(s) Signed: 02/02/2018 5:25:23 PM By: Alejandro Mulling Entered By: Alejandro Mulling on 02/02/2018 08:25:57 Manzano, Latoyia ([id number]) -------------------------------------------------------------------------------- Arrival Information Details Patient Name: [name] Date of Service: 02/02/2018 8:00 AM Medical Record Number: [id number] Patient Account Number: [id number] Date of Birth/Sex: October 18, 1970 (46 y.o. Female) Treating RN: Ashok Cordia, Debi Primary Care Prajwal Fellner: Sandrea Hughs Other Clinician: Referring Danaya Geddis: Sandrea Hughs Treating Jaselynn Tamas/Extender: Altamese [place] in Treatment: 0 Visit Information Patient Arrived: Ambulatory Arrival Time: 08:22 Accompanied By: daughter Transfer Assistance: None Patient Identification Verified: Yes Secondary Verification Process Completed: Yes Patient Requires Transmission-Based No Precautions: Patient Has Alerts: Yes Patient Alerts: DM II Electronic Signature(s) Signed: 02/02/2018 5:25:23 PM By: Alejandro Mulling Entered By: Alejandro Mulling on 02/02/2018 08:22:32 Friedl, Marlys ([id number]) -------------------------------------------------------------------------------- Clinic Level of Care Assessment Details Patient Name: [name] Date of Service: 02/02/2018 8:00 AM Medical Record Number: [id number] Patient Account Number: [id number] Date of Birth/Sex: Oct 16, 1970 (46 y.o. Female) Treating RN: Renne Crigler Primary Care  Jocie Meroney: Sandrea Hughs Other Clinician: Referring Trinidi Toppins: Sandrea Hughs Treating Terrel Nesheiwat/Extender: Altamese [place] in Treatment: 0 Clinic Level of Care Assessment Items TOOL 1 Quantity Score X - Use when EandM and Procedure is performed on INITIAL visit 1 0 ASSESSMENTS - Nursing Assessment / Reassessment X - General Physical Exam (combine w/ comprehensive assessment (listed just below) when 1 20 performed on new pt. evals) X- 1 25 Comprehensive Assessment (HX, ROS, Risk Assessments, Wounds Hx, etc.) ASSESSMENTS - Wound and Skin Assessment / Reassessment X - Dermatologic / Skin Assessment (not related to wound area) 1 10 ASSESSMENTS - Ostomy and/or Continence Assessment and Care []  - Incontinence Assessment and Management 0 []  - 0 Ostomy Care Assessment and Management (repouching, etc.) PROCESS - Coordination of Care X - Simple Patient / Family Education for ongoing care 1 15 []  - 0 Complex (extensive) Patient / Family Education for ongoing care []  - 0 Staff obtains Chiropractor, Records, Test Results / Process Orders []  - 0 Staff telephones HHA, Nursing Homes / Clarify orders / etc []  - 0 Routine Transfer to another Facility (non-emergent condition) []  - 0 Routine Hospital Admission (non-emergent condition) []  - 0 New Admissions / Manufacturing engineer / Ordering NPWT, Apligraf, etc. []  - 0 Emergency Hospital Admission (emergent condition) PROCESS - Special Needs []  - Pediatric / Minor Patient Management 0 []  - 0 Isolation Patient Management []  - 0 Hearing / Language / Visual special needs []  - 0 Assessment of Community assistance (transportation, D/C planning, etc.) []  - 0 Additional assistance / Altered mentation []  - 0 Support Surface(s) Assessment (bed, cushion, seat, etc.) Woodin, Eiliana ([id number]) INTERVENTIONS - Miscellaneous []  - External ear exam 0 []  - 0 Patient Transfer (multiple staff / Nurse, adult / Similar devices) []  - 0 Simple Staple /  Suture removal (25 or less) []  - 0 Complex Staple / Suture removal (26 or more) []  - 0 Hypo/Hyperglycemic Management (do not check if billed separately) X- 1 15 Ankle / Brachial Index (ABI) - do not check if billed separately Has the patient been seen at the hospital within the last three years: Yes Total Score: 85 Level Of  Care: New/Established - Level 3 Electronic Signature(s) Signed: 02/02/2018 5:14:04 PM By: Renne Crigler Entered By: Renne Crigler on 02/02/2018 09:26:32 Spiegelman, Buford ([id number]) -------------------------------------------------------------------------------- Encounter Discharge Information Details Patient Name: [name] Date of Service: 02/02/2018 8:00 AM Medical Record Number: [id number] Patient Account Number: [id number] Date of Birth/Sex: 06/26/1971 (47 y.o. Female) Treating RN: Curtis Sites Primary Care Bernisha Verma: Sandrea Hughs Other Clinician: Referring Alyssandra Hulsebus: Sandrea Hughs Treating Alliah Boulanger/Extender: Altamese [place] in Treatment: 0 Encounter Discharge Information Items Discharge Condition: Stable Ambulatory Status: Ambulatory Discharge Destination: Home Transportation: Private Auto Accompanied By: dtr Schedule Follow-up Appointment: Yes Clinical Summary of Care: Electronic Signature(s) Signed: 02/02/2018 9:56:19 AM By: Curtis Sites Entered By: Curtis Sites on 02/02/2018 09:56:18 Icenhower, Tuleen ([id number]) -------------------------------------------------------------------------------- Lower Extremity Assessment Details Patient Name: [name] Date of Service: 02/02/2018 8:00 AM Medical Record Number: [id number] Patient Account Number: [id number] Date of Birth/Sex: 10/02/1970 (46 y.o. Female) Treating RN: Ashok Cordia, Debi Primary Care Maicie Vanderloop: Sandrea Hughs Other Clinician: Referring Dawne Casali: Sandrea Hughs Treating Jehu Mccauslin/Extender: Maxwell Caul Weeks in Treatment: 0 Edema Assessment Assessed: [Left: No] [Right:  No] [Left: Edema] [Right: :] Calf Left: Right: Point of Measurement: 32 cm From Medial Instep 37.2 cm 36 cm Ankle Left: Right: Point of Measurement: 10 cm From Medial Instep 21.6 cm 21.3 cm Vascular Assessment Pulses: Dorsalis Pedis Palpable: [Left:Yes] [Right:Yes] Posterior Tibial Extremity colors, hair growth, and conditions: Extremity Color: [Left:Mottled] [Right:Mottled] Hair Growth on Extremity: [Left:No] [Right:No] Temperature of Extremity: [Left:Cool] [Right:Warm] Capillary Refill: [Left:< 3 seconds] [Right:< 3 seconds] Blood Pressure: Brachial: [Left:138] [Right:110] Dorsalis Pedis: 108 [Left:Dorsalis Pedis: 150] Ankle: Posterior Tibial: 124 [Left:Posterior Tibial: 130 0.90] [Right:1.09] Toe Nail Assessment Left: Right: Thick: No No Discolored: No No Deformed: No No Improper Length and Hygiene: No No Electronic Signature(s) Signed: 02/02/2018 5:25:23 PM By: Alejandro Mulling Entered By: Alejandro Mulling on 02/02/2018 08:55:34 Stogsdill, Jourden ([id number]) -------------------------------------------------------------------------------- Multi Wound Chart Details Patient Name: [name] Date of Service: 02/02/2018 8:00 AM Medical Record Number: [id number] Patient Account Number: [id number] Date of Birth/Sex: 09-14-1970 (47 y.o. Female) Treating RN: Renne Crigler Primary Care Marquis Down: Sandrea Hughs Other Clinician: Referring Ezzie Senat: Sandrea Hughs Treating [place] Liz/Extender: Maxwell Caul Weeks in Treatment: 0 Vital Signs Height(in): 65 Pulse(bpm): 89 Weight(lbs): 166.8 Blood Pressure(mmHg): 104/69 Body Mass Index(BMI): 28 Temperature(F): 98.0 Respiratory Rate 18 (breaths/min): Photos: [1:No Photos] [N/A:N/A] Wound Location: [1:Left, Lateral Lower Leg] [N/A:N/A] Wounding Event: [1:Gradually Appeared] [N/A:N/A] Primary Etiology: [1:Venous Leg Ulcer] [N/A:N/A] Comorbid History: [1:Type II Diabetes] [N/A:N/A] Date Acquired: [1:01/19/2018]  [N/A:N/A] Weeks of Treatment: [1:0] [N/A:N/A] Wound Status: [1:Open] [N/A:N/A] Measurements L x W x D [1:2.3x1.5x0.1] [N/A:N/A] (cm) Area (cm) : [1:2.71] [N/A:N/A] Volume (cm) : [1:0.271] [N/A:N/A] Classification: [1:Full Thickness Without Exposed Support Structures] [N/A:N/A] Exudate Amount: [1:Medium] [N/A:N/A] Exudate Type: [1:Serosanguineous] [N/A:N/A] Exudate Color: [1:red, brown] [N/A:N/A] Wound Margin: [1:Flat and Intact] [N/A:N/A] Granulation Amount: [1:None Present (0%)] [N/A:N/A] Necrotic Amount: [1:Large (67-100%)] [N/A:N/A] Necrotic Tissue: [1:Eschar, Adherent Slough] [N/A:N/A] Exposed Structures: [1:Fascia: No Fat Layer (Subcutaneous Tissue) Exposed: No Tendon: No Muscle: No Joint: No Bone: No] [N/A:N/A] Epithelialization: [1:None] [N/A:N/A] Debridement: [1:Debridement - Excisional] [N/A:N/A] Pre-procedure [1:09:19] [N/A:N/A] Verification/Time Out Taken: Pain Control: [1:Other] [N/A:N/A] Tissue Debrided: [1:Subcutaneous, Slough] [N/A:N/A] Level: [1:Skin/Subcutaneous Tissue] [N/A:N/A] Debridement Area (sq cm): [1:3.45] [N/A:N/A] Instrument: [1:Curette] [N/A:N/A] Bleeding: Moderate N/A N/A Hemostasis Achieved: Pressure N/A N/A Procedural Pain: 0 N/A N/A Post Procedural Pain: 0 N/A N/A Debridement Treatment Procedure was tolerated well N/A N/A Response: Post Debridement 2.3x1.5x0.2 N/A N/A Measurements L x W x D (cm) Post Debridement Volume: 0.542 N/A N/A (  cm) Periwound Skin Texture: No Abnormalities Noted N/A N/A Periwound Skin Moisture: No Abnormalities Noted N/A N/A Periwound Skin Color: Erythema: Yes N/A N/A Erythema Location: Circumferential N/A N/A Tenderness on Palpation: No N/A N/A Wound Preparation: Ulcer Cleansing: N/A N/A Rinsed/Irrigated with Saline Topical Anesthetic Applied: Other: lidocaine 4% Procedures Performed: Debridement N/A N/A Treatment Notes Electronic Signature(s) Signed: 02/02/2018 6:13:38 PM By: Baltazar Najjarobson, Michael MD Entered  By: Baltazar Najjarobson, Michael on 02/02/2018 09:43:45 Jayson, Kaelynn ([id number]) -------------------------------------------------------------------------------- Multi-Disciplinary Care Plan Details Patient Name: [name] MassyGARCIA, Nalina Date of Service: 02/02/2018 8:00 AM Medical Record Number: [id number] Patient Account Number: [id number] Date of Birth/Sex: 09-29-1970 (47 y.o. Female) Treating RN: Renne CriglerFlinchum, Cheryl Primary Care Dearion Huot: Sandrea HughsUBIO, JESSICA Other Clinician: Referring Forever Arechiga: Sandrea HughsUBIO, JESSICA Treating Chalyn Amescua/Extender: Altamese CarolinaOBSON, MICHAEL G Weeks in Treatment: 0 Active Inactive ` Orientation to the Wound Care Program Nursing Diagnoses: Knowledge deficit related to the wound healing center program Goals: Patient/caregiver will verbalize understanding of the Wound Healing Center Program Date Initiated: 02/02/2018 Target Resolution Date: 03/02/2018 Goal Status: Active Interventions: Provide education on orientation to the wound center Notes: ` Wound/Skin Impairment Nursing Diagnoses: Impaired tissue integrity Goals: Patient/caregiver will verbalize understanding of skin care regimen Date Initiated: 02/02/2018 Target Resolution Date: 03/02/2018 Goal Status: Active Ulcer/skin breakdown will have a volume reduction of 30% by week 4 Date Initiated: 02/02/2018 Target Resolution Date: 03/02/2018 Goal Status: Active Interventions: Assess patient/caregiver ability to obtain necessary supplies Assess ulceration(s) every visit Treatment Activities: Skin care regimen initiated : 02/02/2018 Notes: Electronic Signature(s) Signed: 02/02/2018 5:14:04 PM By: Renne CriglerFlinchum, Cheryl Entered By: Renne CriglerFlinchum, Cheryl on 02/02/2018 09:16:33 Paola, Kaydence ([id number]) Egnew, Jaylan ([id number]) -------------------------------------------------------------------------------- Pain Assessment Details Patient Name: [name] MassyGARCIA, Ceclia Date of Service: 02/02/2018 8:00 AM Medical Record Number: [id number] Patient Account Number:  [id number] Date of Birth/Sex: 09-29-1970 (47 y.o. Female) Treating RN: Ashok CordiaPinkerton, Debi Primary Care Avana Kreiser: Sandrea HughsUBIO, JESSICA Other Clinician: Referring Ashlin Kreps: Sandrea HughsUBIO, JESSICA Treating [place] Zaragoza/Extender: Maxwell CaulOBSON, MICHAEL G Weeks in Treatment: 0 Active Problems Location of Pain Severity and Description of Pain Patient Has Paino Yes Site Locations Rate the pain. Current Pain Level: 6 Character of Pain Describe the Pain: Aching Pain Management and Medication Current Pain Management: Electronic Signature(s) Signed: 02/02/2018 5:25:23 PM By: Alejandro MullingPinkerton, Debra Entered By: Alejandro MullingPinkerton, Debra on 02/02/2018 08:23:04 Kittelson, Guerline ([id number]) -------------------------------------------------------------------------------- Patient/Caregiver Education Details Patient Name: [name] MassyGARCIA, Lesette Date of Service: 02/02/2018 8:00 AM Medical Record Number: [id number] Patient Account Number: [id number] Date of Birth/Gender: 09-29-1970 (47 y.o. Female) Treating RN: Curtis Sitesorthy, Joanna Primary Care Physician: Sandrea HughsUBIO, JESSICA Other Clinician: Referring Physician: Sandrea HughsUBIO, JESSICA Treating Physician/Extender: Altamese CarolinaOBSON, MICHAEL G Weeks in Treatment: 0 Education Assessment Education Provided To: Patient Education Topics Provided Venous: Handouts: Other: wrap precautions Methods: Explain/Verbal Responses: State content correctly Electronic Signature(s) Signed: 02/02/2018 5:21:12 PM By: Curtis Sitesorthy, Joanna Entered By: Curtis Sitesorthy, Joanna on 02/02/2018 09:56:39 Ghan, Odeth ([id number]) -------------------------------------------------------------------------------- Wound Assessment Details Patient Name: [name] MassyGARCIA, Shanaye Date of Service: 02/02/2018 8:00 AM Medical Record Number: [id number] Patient Account Number: [id number] Date of Birth/Sex: 09-29-1970 (47 y.o. Female) Treating RN: Ashok CordiaPinkerton, Debi Primary Care Elvi Leventhal: Sandrea HughsUBIO, JESSICA Other Clinician: Referring Shiza Thelen: Sandrea HughsUBIO, JESSICA Treating Amreen Raczkowski/Extender: Maxwell CaulOBSON, MICHAEL  G Weeks in Treatment: 0 Wound Status Wound Number: 1 Primary Etiology: Venous Leg Ulcer Wound Location: Left, Lateral Lower Leg Wound Status: Open Wounding Event: Gradually Appeared Comorbid History: Type II Diabetes Date Acquired: 01/19/2018 Weeks Of Treatment: 0 Clustered Wound: No Photos Photo Uploaded By: Alejandro MullingPinkerton, Debra on 02/03/2018 08:08:46 Wound Measurements Length: (cm) 2.3 Width: (cm) 1.5 Depth: (cm) 0.1 Area: (cm) 2.71 Volume: (cm) 0.271 % Reduction in Area: %  Reduction in Volume: Epithelialization: None Tunneling: No Undermining: No Wound Description Full Thickness Without Exposed Support Classification: Structures Wound Margin: Flat and Intact Exudate Medium Amount: Exudate Type: Serosanguineous Exudate Color: red, brown Foul Odor After Cleansing: No Slough/Fibrino Yes Wound Bed Granulation Amount: None Present (0%) Exposed Structure Necrotic Amount: Large (67-100%) Fascia Exposed: No Necrotic Quality: Eschar, Adherent Slough Fat Layer (Subcutaneous Tissue) Exposed: No Tendon Exposed: No Muscle Exposed: No Joint Exposed: No Bone Exposed: No Blodgett, Chontel ([id number]) Periwound Skin Texture Texture Color No Abnormalities Noted: No No Abnormalities Noted: No Erythema: Yes Moisture Erythema Location: Circumferential No Abnormalities Noted: No Wound Preparation Ulcer Cleansing: Rinsed/Irrigated with Saline Topical Anesthetic Applied: Other: lidocaine 4%, Treatment Notes Wound #1 (Left, Lateral Lower Leg) 1. Cleansed with: Clean wound with Normal Saline 2. Anesthetic Topical Lidocaine 4% cream to wound bed prior to debridement 4. Dressing Applied: Iodoflex 5. Secondary Dressing Applied ABD Pad 7. Secured with 3 Layer Compression System - Left Lower Extremity Electronic Signature(s) Signed: 02/02/2018 5:25:23 PM By: Alejandro Mulling Entered By: Alejandro Mulling on 02/02/2018 08:58:27 Stamant, Neriah  ([id number]) -------------------------------------------------------------------------------- Vitals Details Patient Name: [name] Date of Service: 02/02/2018 8:00 AM Medical Record Number: [id number] Patient Account Number: [id number] Date of Birth/Sex: January 07, 1971 (47 y.o. Female) Treating RN: Ashok Cordia, Debi Primary Care Challen Spainhour: Sandrea Hughs Other Clinician: Referring Itzabella Sorrels: Sandrea Hughs Treating Marcile Fuqu[place]/Extender: Maxwell Caul Weeks in Treatment: 0 Vital Signs Time Taken: 08:23 Temperature (F): 98.0 Height (in): 65 Pulse (bpm): 89 Source: Stated Respiratory Rate (breaths/min): 18 Weight (lbs): 166.8 Blood Pressure (mmHg): 104/69 Source: Measured Reference Range: 80 - 120 mg / dl Body Mass Index (BMI): 27.8 Electronic Signature(s) Signed: 02/02/2018 5:25:23 PM By: Alejandro Mulling Entered By: Alejandro Mulling on 02/02/2018 08:24:40

## 2018-02-03 NOTE — Progress Notes (Signed)
[place], [name] ([id number]) Visit Report for 02/02/2018 Abuse/Suicide Risk Screen Details Patient Name: [name], [name] Date of Service: 02/02/2018 8:00 AM Medical Record Number: [id number] Patient Account Number: [id number] Date of Birth/Sex: 02-06-1971 (47 y.o. Female) Treating RN: Ashok Cordia, Debi Primary Care Tasneem Cormier: Sandrea Hughs Other Clinician: Referring Woody Kronberg: Sandrea Hughs Treating Joeziah Voit/Extender: Maxwell Caul Weeks in Treatment: 0 Abuse/Suicide Risk Screen Items Answer ABUSE/SUICIDE RISK SCREEN: Has anyone close to you tried to hurt or harm you recentlyo No Do you feel uncomfortable with anyone in your familyo No Has anyone forced you do things that you didnot want to doo No Do you have any thoughts of harming yourselfo No Patient displays signs or symptoms of abuse and/or neglect. No Electronic Signature(s) Signed: 02/02/2018 5:25:23 PM By: Alejandro Mulling Entered By: Alejandro Mulling on 02/02/2018 08:36:38 Bevans, Diedre ([id number]) -------------------------------------------------------------------------------- Activities of Daily Living Details Patient Name: [name] Date of Service: 02/02/2018 8:00 AM Medical Record Number: [id number] Patient Account Number: [id number] Date of Birth/Sex: 03-20-1971 (47 y.o. Female) Treating RN: Ashok Cordia, Debi Primary Care Damare Serano: Sandrea Hughs Other Clinician: Referring Chantz Montefusco: Sandrea Hughs Treating Barth Trella/Extender: Maxwell Caul Weeks in Treatment: 0 Activities of Daily Living Items Answer Activities of Daily Living (Please select one for each item) Drive Automobile Completely Able Take Medications Completely Able Use Telephone Completely Able Care for Appearance Completely Able Use Toilet Completely Able Bath / Shower Completely Able Dress Self Completely Able Feed Self Completely Able Walk Completely Able Get In / Out Bed Completely Able Housework Completely Able Prepare Meals Completely  Able Handle Money Completely Able Shop for Self Completely Able Electronic Signature(s) Signed: 02/02/2018 5:25:23 PM By: Alejandro Mulling Entered By: Alejandro Mulling on 02/02/2018 08:37:03 Krupinski, Rhett ([id number]) -------------------------------------------------------------------------------- Education Assessment Details Patient Name: [name] Date of Service: 02/02/2018 8:00 AM Medical Record Number: [id number] Patient Account Number: [id number] Date of Birth/Sex: Nov 16, 1970 (46 y.o. Female) Treating RN: Ashok Cordia, Debi Primary Care Jaysean Manville: Sandrea Hughs Other Clinician: Referring Tiarra Anastacio: Sandrea Hughs Treating Nilda Keathley/Extender: Altamese [place] in Treatment: 0 Primary Learner Assessed: Patient Learning Preferences/Education Level/Primary Language Learning Preference: Explanation, Printed Material Highest Education Level: Grade School Preferred Language: Spanish Cognitive Barrier Assessment/Beliefs Language Barrier: Yes spanish Physical Barrier Assessment Impaired Vision: No Impaired Hearing: No Decreased Hand dexterity: No Knowledge/Comprehension Assessment Knowledge Level: Medium Comprehension Level: Medium Ability to understand written Medium instructions: Motivation Assessment Anxiety Level: Calm Cooperation: Cooperative Education Importance: Acknowledges Need Interest in Health Problems: Asks Questions Perception: Coherent Willingness to Engage in Self- Medium Management Activities: Readiness to Engage in Self- Medium Management Activities: Electronic Signature(s) Signed: 02/02/2018 5:25:23 PM By: Alejandro Mulling Entered By: Alejandro Mulling on 02/02/2018 08:37:31 Pena, Kerline ([id number]) -------------------------------------------------------------------------------- Fall Risk Assessment Details Patient Name: [name] Date of Service: 02/02/2018 8:00 AM Medical Record Number: [id number] Patient Account Number: [id number] Date of  Birth/Sex: 1970-10-30 (47 y.o. Female) Treating RN: Ashok Cordia, Debi Primary Care Kramer Hanrahan: Sandrea Hughs Other Clinician: Referring Kainat Pizana: Sandrea Hughs Treating Jerah Esty/Extender: Altamese [place] in Treatment: 0 Fall Risk Assessment Items Have you had 2 or more falls in the last 12 monthso 0 No Have you had any fall that resulted in injury in the last 12 monthso 0 No FALL RISK ASSESSMENT: History of falling - immediate or within 3 months 0 No Secondary diagnosis 0 No Ambulatory aid None/bed rest/wheelchair/nurse 0 No Crutches/cane/walker 0 No Furniture 0 No IV Access/Saline Lock 0 No Gait/Training Normal/bed rest/immobile 0 No Weak 0 No Impaired 0 No Mental Status Oriented to own ability 0 Yes Electronic Signature(s)  Signed: 02/02/2018 5:25:23 PM By: Alejandro MullingPinkerton, Debra Entered By: Alejandro MullingPinkerton, Debra on 02/02/2018 08:37:41 Dinsmore, Miniya ([id number]) -------------------------------------------------------------------------------- Foot Assessment Details Patient Name: [name] MassyGARCIA, Doha Date of Service: 02/02/2018 8:00 AM Medical Record Number: [id number] Patient Account Number: [id number] Date of Birth/Sex: [birth year] (46 y.o. Female) Treating RN: Ashok CordiaPinkerton, Debi Primary Care [name]: Sandrea HughsUBIO, JESSICA Other Clinician: Referring [name]: Sandrea HughsUBIO, JESSICA Treating Avrian Delfavero/Extender: Maxwell CaulOBSON, MICHAEL G Weeks in Treatment: 0 Foot Assessment Items Site Locations + = Sensation present, - = Sensation absent, C = Callus, U = Ulcer R = Redness, W = Warmth, M = Maceration, PU = Pre-ulcerative lesion F = Fissure, S = Swelling, D = Dryness Assessment Right: Left: Other Deformity: No No Prior Foot Ulcer: No No Prior Amputation: No No Charcot Joint: No No Ambulatory Status: Ambulatory Without Help Gait: Steady Electronic Signature(s) Signed: 02/02/2018 5:25:23 PM By: Alejandro MullingPinkerton, Debra Entered By: Alejandro MullingPinkerton, Debra on 02/02/2018 08:40:14 Otterson, Britlyn  ([id number]) -------------------------------------------------------------------------------- Nutrition Risk Assessment Details Patient Name: [name] MassyGARCIA, Sharifa Date of Service: 02/02/2018 8:00 AM Medical Record Number: [id number] Patient Account Number: [id number] Date of Birth/Sex: [birth year] (47 y.o. Female) Treating RN: Ashok CordiaPinkerton, Debi Primary Care Rashanda Magloire: Sandrea HughsUBIO, JESSICA Other Clinician: Referring Lillias Difrancesco: Sandrea HughsUBIO, JESSICA Treating Guenther [place]/Extender: Maxwell CaulOBSON, MICHAEL G Weeks in Treatment: 0 Height (in): 65 Weight (lbs): 166.8 Body Mass Index (BMI): 27.8 Nutrition Risk Assessment Items NUTRITION RISK SCREEN: I have an illness or condition that made me change the kind and/or amount of 0 No food I eat I eat fewer than two meals per day 0 No I eat few fruits and vegetables, or milk products 0 No I have three or more drinks of beer, liquor or wine almost every day 0 No I have tooth or mouth problems that make it hard for me to eat 0 No I don't always have enough money to buy the food I need 0 No I eat alone most of the time 0 No I take three or more different prescribed or over-the-counter drugs a day 1 Yes Without wanting to, I have lost or gained 10 pounds in the last six months 0 No I am not always physically able to shop, cook and/or feed myself 0 No Nutrition Protocols Good Risk Protocol Moderate Risk Protocol Electronic Signature(s) Signed: 02/02/2018 5:25:23 PM By: Alejandro MullingPinkerton, Debra Entered By: Alejandro MullingPinkerton, Debra on 02/02/2018 08:38:23

## 2018-02-04 ENCOUNTER — Other Ambulatory Visit: Payer: Self-pay

## 2018-02-09 ENCOUNTER — Encounter: Payer: 59 | Admitting: Internal Medicine

## 2018-02-09 DIAGNOSIS — E11622 Type 2 diabetes mellitus with other skin ulcer: Secondary | ICD-10-CM | POA: Diagnosis not present

## 2018-02-09 DIAGNOSIS — I87312 Chronic venous hypertension (idiopathic) with ulcer of left lower extremity: Secondary | ICD-10-CM | POA: Diagnosis not present

## 2018-02-09 DIAGNOSIS — E1142 Type 2 diabetes mellitus with diabetic polyneuropathy: Secondary | ICD-10-CM | POA: Diagnosis not present

## 2018-02-09 DIAGNOSIS — L97221 Non-pressure chronic ulcer of left calf limited to breakdown of skin: Secondary | ICD-10-CM | POA: Diagnosis not present

## 2018-02-09 DIAGNOSIS — L97222 Non-pressure chronic ulcer of left calf with fat layer exposed: Secondary | ICD-10-CM | POA: Diagnosis not present

## 2018-02-09 DIAGNOSIS — Z7984 Long term (current) use of oral hypoglycemic drugs: Secondary | ICD-10-CM | POA: Diagnosis not present

## 2018-02-09 DIAGNOSIS — I87311 Chronic venous hypertension (idiopathic) with ulcer of right lower extremity: Secondary | ICD-10-CM | POA: Diagnosis not present

## 2018-02-12 NOTE — Progress Notes (Signed)
[name], [name] ([id number]) Visit Report for 02/09/2018 Debridement Details Patient Name: [name], [name] Date of Service: 02/09/2018 9:00 AM Medical Record Number: [id number] Patient Account Number: [id number] Date of Birth/Sex: 1971-06-05 (47 y.o. F) Treating RN: [name] Primary Care Provider: Sandrea [name] Other Clinician: Referring Provider: Sandrea [name] Treating Provider/Extender: [name] [place] in Treatment: 1 Debridement Performed for Wound #1 Left,Lateral Lower Leg Assessment: Performed By: Physician [name] Caul, MD Debridement Type: Debridement Severity of Tissue Pre Fat layer exposed Debridement: Pre-procedure Verification/Time Yes - 09:39 Out Taken: Start Time: 09:39 Pain Control: Other : lidocaine 4% Total Area Debrided (L x W): 2.2 (cm) x 1.2 (cm) = 2.64 (cm) Tissue and other material Viable, Non-Viable, Eschar, Slough, Subcutaneous, Biofilm, Slough debrided: Level: Skin/Subcutaneous Tissue Debridement Description: Excisional Instrument: Curette Bleeding: Minimum Hemostasis Achieved: Pressure End Time: 09:41 Procedural Pain: 0 Post Procedural Pain: 0 Response to Treatment: Procedure was tolerated well Level of Consciousness: Awake and Alert Post Debridement Measurements of Total Wound Length: (cm) 2.2 Width: (cm) 1.2 Depth: (cm) 0.1 Volume: (cm) 0.207 Character of Wound/Ulcer Post Debridement: Stable Severity of Tissue Post Debridement: Fat layer exposed Post Procedure Diagnosis Same as Pre-procedure Electronic Signature(s) Signed: 02/09/2018 5:29:35 PM By: [name] Signed: 02/11/2018 7:56:50 AM By: [name] Najjar MD Entered By: [name] on 02/09/2018 09:43:56 [name] ([id number]) -------------------------------------------------------------------------------- HPI Details Patient Name: [name] Date of Service: 02/09/2018 9:00 AM Medical Record Number: [id number] Patient Account Number: [id number] Date of  Birth/Sex: 08-06-1970 (47 y.o. F) Treating RN: [name] Primary Care Provider: Sandrea [name] Other Clinician: Referring Provider: Sandrea [name] Treating Provider/Extender: [name] [place] in Treatment: 1 History of Present Illness HPI Description: ADMISSION 02/02/18 This is a 47 year old woman who is a poorly controlled type 2 diabetes with a recent hemoglobin A1c at the end of June/19 of 13.9. She's had medication adjustments made by her primary physician. I think her compliance with insulin has been poor. She tells me that 2 weeks ago she noticed a burning or stinging sensation on the left lateral leg. She thought something had better and then the area began itching. Her doctor prescribes some form of topical cream. She was also started on cephalexin and perhaps miconazole by her primary physician on July 9. She does not describe claudication. She is not a smoker. She does not have a wound history ABIs in our clinic were 0.9 on the right and 1.09 on the left 02/09/18; wound is about the same in terms of dimensions. Surface looks slightly better this week using Iodoflex under compression. She has poorly controlled diabetes but is working on this. I don't believe she has an arterial issue Electronic Signature(s) Signed: 02/11/2018 7:56:50 AM By: [name] Najjar MD Entered By: [name] on 02/09/2018 09:44:41 [name], [name] ([id number]) -------------------------------------------------------------------------------- Physical Exam Details Patient Name: [name] Date of Service: 02/09/2018 9:00 AM Medical Record Number: [id number] Patient Account Number: [id number] Date of Birth/Sex: 1971/06/25 (47 y.o. F) Treating RN: [name] Primary Care Provider: Sandrea [name] Other Clinician: Referring Provider: Sandrea [name] Treating Provider/Extender: [name] We[place] in Treatment: 1 Cardiovascular pulses normal on the left. Notes when exam; area of the left  lateral calf which is about the size of a quarter. There is a less adherent necrotic debris the last week but still requiring debridement with a #5 curet. Hemostasis with direct pressure. She tolerates this well. She has decent edema control Electronic Signature(s) Signed: 02/11/2018 7:56:50 AM By: [name] Najjar MD Entered By: [name] on 02/09/2018 09:45:22  [name] ([id number]) -------------------------------------------------------------------------------- Physician Orders Details Patient Name: [name] Date of Service: 02/09/2018 9:00 AM Medical Record Number: [id number] Patient Account Number: [id number] Date of Birth/Sex: 1970-12-30 (47 y.o. F) Treating RN: [name] CriglerFlinchum, [name] Primary Care Provider: Sandrea HughsUBIO, JESSICA Other Clinician: Referring Provider: Sandrea HughsUBIO, JESSICA Treating Provider/Extender: [name] CarolinaOBSON, [name] Kosel G Weeks in Treatment: 1 Verbal / Phone Orders: No Diagnosis Coding Wound Cleansing Wound #1 Left,Lateral Lower Leg o Clean wound with Normal Saline. o May shower with protection. Anesthetic (add to Medication List) Wound #1 Left,Lateral Lower Leg o Topical Lidocaine 4% cream applied to wound bed prior to debridement (In Clinic Only). Primary Wound Dressing Wound #1 Left,Lateral Lower Leg o Iodoflex Secondary Dressing Wound #1 Left,Lateral Lower Leg o ABD pad Dressing Change Frequency Wound #1 Left,Lateral Lower Leg o Change dressing every week Follow-up Appointments Wound #1 Left,Lateral Lower Leg o Return Appointment in 1 week. o Nurse Visit as needed Edema Control Wound #1 Left,Lateral Lower Leg o 3 Layer Compression System - Left Lower Extremity Electronic Signature(s) Signed: 02/09/2018 4:58:57 PM By: [name] CriglerFlinchum, [name] Signed: 02/11/2018 7:56:50 AM By: [name] Najjarobson, [name] Trawick MD Entered By: [name] CriglerFlinchum, [name] on 02/09/2018 09:41:22 [name], [name]  ([id number]) -------------------------------------------------------------------------------- Problem List Details Patient Name: [name] Date of Service: 02/09/2018 9:00 AM Medical Record Number: [id number] Patient Account Number: [id number] Date of Birth/Sex: 1970-12-30 (47 y.o. 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 No change in odebridement o Medical wound care dimension Iodoflex leave under Decision 3K impression all Making : week I87.311 Chronic venous hypertension (idiopathic) with ulcer of right 02/02/2018 Yes Yes lower extremity Status Complications Interventions Improving edema control is reasonable she is going to need Medical some compression Decision probably 3K Making : compression today E11.622 Type 2 diabetes mellitus with other skin ulcer 02/02/2018 No Yes E11.42 Type 2 diabetes mellitus with diabetic polyneuropathy 02/02/2018 No Yes Inactive Problems Resolved Problems Electronic Signature(s) Signed: 02/11/2018 7:56:50 AM By: [name] Najjarobson, Gayatri Teasdale MD Entered By: [name] Najjarobson, Vivek Grealish on 02/09/2018 09:43:22 [name], [name] ([id number]) -------------------------------------------------------------------------------- Progress Note Details Patient Name: [name] MassyGARCIA, Carson Date of Service: 02/09/2018 9:00 AM Medical Record Number: [id number] Patient Account Number: [id number] Date of Birth/Sex: 1970-12-30 (47 y.o. F) Treating RN: [name] Sitesorthy, Joanna Primary Care Provider: Sandrea HughsUBIO, JESSICA Other Clinician: Referring Provider: Sandrea HughsUBIO, JESSICA Treating Provider/Extender:  [name] CarolinaOBSON, Mykelle Cockerell G Weeks in Treatment: 1 Subjective History of Present Illness (HPI) ADMISSION 02/02/18 This is a 47 year old woman who is a poorly controlled type 2 diabetes with a recent hemoglobin A1c at the end of June/19 of 13.9. She's had medication adjustments made by her primary physician. I think her compliance with insulin has been poor. She tells me that 2 weeks ago she noticed a burning or stinging sensation on the left lateral leg. She thought something had better and then the area began itching. Her doctor prescribes some form of topical cream. She was also started on cephalexin and perhaps miconazole by her primary physician on July 9. She does not describe claudication. She is not a smoker. She does not have a wound history ABIs in our clinic were 0.9 on the right and 1.09 on the left 02/09/18; wound is about the same in terms of dimensions. Surface looks slightly better this week  using Iodoflex under compression. She has poorly controlled diabetes but is working on this. I don't believe she has an arterial issue Objective Constitutional Vitals Time Taken: 9:23 AM, Height: 65 in, Weight: 166.8 lbs, BMI: 27.8, Temperature: 98.0 F, Pulse: 83 bpm, Respiratory Rate: 16 breaths/min, Blood Pressure: 123/76 mmHg. Cardiovascular pulses normal on the left. General Notes: when exam; area of the left lateral calf which is about the size of a quarter. There is a less adherent necrotic debris the last week but still requiring debridement with a #5 curet. Hemostasis with direct pressure. She tolerates this well. She has decent edema control Integumentary (Hair, Skin) Wound #1 status is Open. Original cause of wound was Gradually Appeared. The wound is located on the Left,Lateral Lower Leg. The wound measures 2.2cm length x 1.2cm width x 0.1cm depth; 2.073cm^2 area and 0.207cm^3 volume. There is Fat Layer (Subcutaneous Tissue) Exposed exposed. There is no tunneling or undermining noted.  There is a medium amount of serosanguineous drainage noted. The wound margin is flat and intact. There is small (1-33%) pink granulation within the wound bed. There is a large (67-100%) amount of necrotic tissue within the wound bed including Adherent Slough. The periwound skin appearance exhibited: Ecchymosis. The periwound skin appearance did not exhibit: Callus, Crepitus, Excoriation, Induration, Rash, Scarring, Dry/Scaly, Maceration, Atrophie Blanche, Cyanosis, Hemosiderin Staining, Mottled, Buffone, Gracyn ([id number]) Pallor, Rubor, Erythema. Periwound temperature was noted as No Abnormality. The periwound has tenderness on palpation. 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 There was a Excisional Skin/Subcutaneous Tissue Debridement with a total area of 2.64 sq cm performed by [name] Caul, MD. With the following instrument(s): Curette to remove Viable and Non-Viable tissue/material. Material removed includes Eschar, Subcutaneous Tissue, Slough, and Biofilm after achieving pain control using Other (lidocaine 4%). No specimens were taken. A time out was conducted at 09:39, prior to the start of the procedure. A Minimum amount of bleeding was controlled with Pressure. The procedure was tolerated well with a pain level of 0 throughout and a pain level of 0 following the procedure. Patient s Level of Consciousness post procedure was recorded as Awake and Alert. Post Debridement Measurements: 2.2cm length x 1.2cm width x 0.1cm depth; 0.207cm^3 volume. Character of Wound/Ulcer Post  Debridement is stable. Severity of Tissue Post Debridement is: Fat layer exposed. Post procedure Diagnosis Wound #1: Same as Pre-Procedure Plan Wound Cleansing: Wound #1 Left,Lateral Lower Leg: Clean wound with Normal Saline. May shower with protection. Anesthetic (add to Medication List): Wound #1 Left,Lateral Lower Leg: Topical Lidocaine 4% cream applied to wound bed prior to debridement (In Clinic Only). Primary Wound Dressing: Wound #1 Left,Lateral Lower Leg: Iodoflex Secondary Dressing: Wound #1 Left,Lateral Lower Leg: ABD pad Dressing Change Frequency: Wound #1 Left,Lateral Lower Leg: Change dressing every week Follow-up Appointments: [name], [name] ([id number]) Wound #1 Left,Lateral Lower Leg: Return Appointment in 1 week. Nurse Visit as needed Edema Control: Wound #1 Left,Lateral Lower Leg: 3 Layer Compression System - Left Lower Extremity Medical Decision Making Non-pressure chronic ulcer of left calf limited to breakdown of skin 02/02/2018 Status: Improving Complications: surface of the wound looks slightly better. No change in wound  care dimension Interventions: debridement Iodoflex leave under 3K impression all week Chronic venous hypertension (idiopathic) with ulcer of right lower extremity 02/02/2018 Status: Improving Complications: edema control is reasonable Interventions: she is going to need some compression probably 3K compression today #1 no change the primary dressing which is Iodoflex to help with the wound debridement. #2 there is no insurance here. No possibility to afford [employer]) Signed: 02/11/2018 7:56:50 AM By: [name] Najjar MD Entered By: [name] on 02/09/2018 09:46:10 [name], [name] ([id number]) -------------------------------------------------------------------------------- SuperBill Details Patient Name: [name] Date of Service: 02/09/2018 Medical Record Number: [id number] Patient Account Number: [id number] Date  of Birth/Sex: 12/29/1970 (47 y.o. F) Treating RN: [name] Primary Care Provider: Sandrea [name] Other Clinician: Referring Provider: Sandrea [name] Treating Provider/Extender: [name] [place] in Treatment: 1 Diagnosis Coding ICD-10 Codes Code Description [phone] Non-pressure chronic ulcer of left calf limited to breakdown of skin I87.311 Chronic venous hypertension (idiopathic) with ulcer of right lower extremity E11.622 Type 2 diabetes mellitus with other skin ulcer E11.42 Type 2 diabetes mellitus with diabetic polyneuropathy Facility Procedures CPT4 Code: [id number] Description: 11042 - DEB SUBQ TISSUE 20 SQ CM/< ICD-10 Diagnosis Description L97.221 Non-pressure chronic ulcer of left calf limited to breakdown o I87.311 Chronic venous hypertension (idiopathic) with ulcer of right l Modifier: f skin ower extremity Quantity: 1 Physician Procedures CPT4 Code: [id number] Description: 11042 - WC PHYS SUBQ TISS 20 SQ CM ICD-10 Diagnosis Description L97.221 Non-pressure chronic ulcer of left calf limited to breakdown o I87.311 Chronic venous hypertension (idiopathic) with ulcer of right l Modifier: f skin ower extremity Quantity: 1 Electronic Signature(s) Signed: 02/11/2018 7:56:50 AM By: [name] Najjar MD Entered By: [name] on 02/09/2018 09:46:33

## 2018-02-12 NOTE — Progress Notes (Signed)
[place], [name] ([id number]) Visit Report for 02/09/2018 Arrival Information Details Patient Name: [name], [name] Date of Service: 02/09/2018 9:00 AM Medical Record Number: [id number] Patient Account Number: [id number] Date of Birth/Sex: [birth year] (47 y.o. F) Treating RN: Curtis Sites Primary Care Sola Margolis: Sandrea Hughs Other Clinician: Referring Tracye Szuch: Sandrea Hughs Treating Ifeoma Vallin/Extender: Altamese [place] in Treatment: 1 Visit Information History Since Last Visit Added or deleted any medications: No Patient Arrived: Cane Any new allergies or adverse reactions: No Arrival Time: 09:22 Had a fall or experienced change in No Accompanied By: interpreter activities of daily living that may affect Transfer Assistance: None risk of falls: Patient Identification Verified: Yes Signs or symptoms of abuse/neglect since last visito No Secondary Verification Process Completed: Yes Hospitalized since last visit: No Patient Requires Transmission-Based No Implantable device outside of the clinic excluding No Precautions: cellular tissue based products placed in the center Patient Has Alerts: Yes since last visit: Patient Alerts: DM II Has Dressing in Place as Prescribed: Yes Has Compression in Place as Prescribed: Yes Pain Present Now: No Electronic Signature(s) Signed: 02/09/2018 5:29:35 PM By: Curtis Sites Entered By: Curtis Sites on 02/09/2018 09:23:06 [name], [name] ([id number]) -------------------------------------------------------------------------------- Encounter Discharge Information Details Patient Name: [name] Date of Service: 02/09/2018 9:00 AM Medical Record Number: [id number] Patient Account Number: [id number] Date of Birth/Sex: 08-20-1970 (47 y.o. F) Treating RN: Curtis Sites Primary Care Kaled Allende: Sandrea Hughs Other Clinician: Referring Philomina Leon: Sandrea Hughs Treating Jontae Sonier/Extender: Altamese [place] in Treatment: 1 Encounter  Discharge Information Items Discharge Condition: Stable Ambulatory Status: Ambulatory Discharge Destination: Home Transportation: Private Auto Accompanied By: translator Schedule Follow-up Appointment: Yes Clinical Summary of Care: Electronic Signature(s) Signed: 02/09/2018 12:15:27 PM By: Curtis Sites Entered By: Curtis Sites on 02/09/2018 12:15:27 [name], [name] ([id number]) -------------------------------------------------------------------------------- Lower Extremity Assessment Details Patient Name: [name] Date of Service: 02/09/2018 9:00 AM Medical Record Number: [id number] Patient Account Number: [id number] Date of Birth/Sex: 1970/09/19 (46 y.o. F) Treating RN: Curtis Sites Primary Care Anaia Frith: Sandrea Hughs Other Clinician: Referring Lotta [place]en[place]: Sandrea Hughs Treating Ronie Fleeger/Extender: Altamese [place] in Treatment: 1 Edema Assessment Assessed: [Left: No] [Right: No] [Left: Edema] [Right: :] Calf Left: Right: Point of Measurement: 32 cm From Medial Instep 36.9 cm cm Ankle Left: Right: Point of Measurement: 10 cm From Medial Instep 21.5 cm cm Vascular Assessment Pulses: Dorsalis Pedis Palpable: [Left:Yes] Posterior Tibial Extremity colors, hair growth, and conditions: Extremity Color: [Left:Normal] Hair Growth on Extremity: [Left:Yes] Temperature of Extremity: [Left:Warm] Capillary Refill: [Left:< 3 seconds] Toe Nail Assessment Left: Right: Thick: No Discolored: No Deformed: No Improper Length and Hygiene: No Electronic Signature(s) Signed: 02/09/2018 5:29:35 PM By: Curtis Sites Entered By: Curtis Sites on 02/09/2018 09:28:05 [name], [name] ([id number]) -------------------------------------------------------------------------------- Multi Wound Chart Details Patient Name: [name] Date of Service: 02/09/2018 9:00 AM Medical Record Number: [id number] Patient Account Number: [id number] Date of Birth/Sex: 04-Feb-1971 (47 y.o. F) Treating  RN: Renne Crigler Primary Care Duru Reiger: Sandrea Hughs Other Clinician: Referring Gilda Abboud: Sandrea Hughs Treating Ranada Vigorito/Extender: Maxwell Caul Weeks in Treatment: 1 Vital Signs Height(in): 65 Pulse(bpm): 83 Weight(lbs): 166.8 Blood Pressure(mmHg): 123/76 Body Mass Index(BMI): 28 Temperature(F): 98.0 Respiratory Rate 16 (breaths/min): Photos: [1:No Photos] [N/A:N/A] Wound Location: [1:Left Lower Leg - Lateral] [N/A:N/A] Wounding Event: [1:Gradually Appeared] [N/A:N/A] Primary Etiology: [1:Venous Leg Ulcer] [N/A:N/A] Comorbid History: [1:Type II Diabetes] [N/A:N/A] Date Acquired: [1:01/19/2018] [N/A:N/A] Weeks of Treatment: [1:1] [N/A:N/A] Wound Status: [1:Open] [N/A:N/A] Measurements L x W x D [1:2.2x1.2x0.1] [N/A:N/A] (cm) Area (cm) : [1:2.073] [N/A:N/A] Volume (cm) : [1:0.207] [N/A:N/A] % Reduction  in Area: [1:23.50%] [N/A:N/A] % Reduction in Volume: [1:23.60%] [N/A:N/A] Classification: [1:Full Thickness Without Exposed Support Structures] [N/A:N/A] Exudate Amount: [1:Medium] [N/A:N/A] Exudate Type: [1:Serosanguineous] [N/A:N/A] Exudate Color: [1:red, brown] [N/A:N/A] Wound Margin: [1:Flat and Intact] [N/A:N/A] Granulation Amount: [1:Small (1-33%)] [N/A:N/A] Granulation Quality: [1:Pink] [N/A:N/A] Necrotic Amount: [1:Large (67-100%)] [N/A:N/A] Exposed Structures: [1:Fat Layer (Subcutaneous Tissue) Exposed: Yes Fascia: No Tendon: No Muscle: No Joint: No Bone: No] [N/A:N/A] Epithelialization: [1:Small (1-33%)] [N/A:N/A] Debridement: [1:Debridement - Excisional] [N/A:N/A] Pre-procedure [1:09:39] [N/A:N/A] Verification/Time Out Taken: Pain Control: [1:Other] [N/A:N/A] Tissue Debrided: [1:Necrotic/Eschar, Subcutaneous, Slough] [N/A:N/A] Level: Skin/Subcutaneous Tissue N/A N/A Debridement Area (sq cm): 2.64 N/A N/A Instrument: Curette N/A N/A Bleeding: Minimum N/A N/A Hemostasis Achieved: Pressure N/A N/A Procedural Pain: 0 N/A N/A Post Procedural  Pain: 0 N/A N/A Debridement Treatment Procedure was tolerated well N/A N/A Response: Post Debridement 2.2x1.2x0.1 N/A N/A Measurements L x W x D (cm) Post Debridement Volume: 0.207 N/A N/A (cm) Periwound Skin Texture: Excoriation: No N/A N/A Induration: No Callus: No Crepitus: No Rash: No Scarring: No Periwound Skin Moisture: Maceration: No N/A N/A Dry/Scaly: No Periwound Skin Color: Ecchymosis: Yes N/A N/A Atrophie Blanche: No Cyanosis: No Erythema: No Hemosiderin Staining: No Mottled: No Pallor: No Rubor: No Temperature: No Abnormality N/A N/A Tenderness on Palpation: Yes N/A N/A Wound Preparation: Ulcer Cleansing: N/A N/A Rinsed/Irrigated with Saline, Other: soap and water Topical Anesthetic Applied: Other: lidocaine 4% Procedures Performed: Debridement N/A N/A Treatment Notes Electronic Signature(s) Signed: 02/11/2018 7:56:50 AM By: Baltazar Najjarobson, Michael MD Entered By: Baltazar Najjarobson, Michael on 02/09/2018 09:43:42 [name], [name] ([id number]) -------------------------------------------------------------------------------- Multi-Disciplinary Care Plan Details Patient Name: [name] MassyGARCIA, [place] Date of Service: 02/09/2018 9:00 AM Medical Record Number: [id number] Patient Account Number: [id number] Date of Birth/Sex: 05/12/1971 (47 y.o. F) Treating RN: Renne CriglerFlinchum, Cheryl Primary Care Sarp Vernier: Sandrea HughsUBIO, JESSICA Other Clinician: Referring Patria Warzecha: Sandrea HughsUBIO, JESSICA Treating Sindee Stucker/Extender: Altamese CarolinaOBSON, MICHAEL G Weeks in Treatment: 1 Active Inactive ` Orientation to the Wound Care Program Nursing Diagnoses: Knowledge deficit related to the wound healing center program Goals: Patient/caregiver will verbalize understanding of the Wound Healing Center Program Date Initiated: 02/02/2018 Target Resolution Date: 03/02/2018 Goal Status: Active Interventions: Provide education on orientation to the wound center Notes: ` Wound/Skin Impairment Nursing Diagnoses: Impaired tissue  integrity Goals: Patient/caregiver will verbalize understanding of skin care regimen Date Initiated: 02/02/2018 Target Resolution Date: 03/02/2018 Goal Status: Active Ulcer/skin breakdown will have a volume reduction of 30% by week 4 Date Initiated: 02/02/2018 Target Resolution Date: 03/02/2018 Goal Status: Active Interventions: Assess patient/caregiver ability to obtain necessary supplies Assess ulceration(s) every visit Treatment Activities: Skin care regimen initiated : 02/02/2018 Notes: Electronic Signature(s) Signed: 02/09/2018 4:58:57 PM By: Renne CriglerFlinchum, Cheryl Entered By: Renne CriglerFlinchum, Cheryl on 02/09/2018 09:39:15 [name], [name] ([id number]) Tesler, Estela ([id number]) -------------------------------------------------------------------------------- Pain Assessment Details Patient Name: [name] MassyGARCIA, Casondra Date of Service: 02/09/2018 9:00 AM Medical Record Number: [id number] Patient Account Number: [id number] Date of Birth/Sex: 05/12/1971 (47 y.o. F) Treating RN: Curtis Sitesorthy, Joanna Primary Care Torah Pinnock: Sandrea HughsUBIO, JESSICA Other Clinician: Referring Gerber Penza: Sandrea HughsUBIO, JESSICA Treating Cecile Gillispie/Extender: Altamese CarolinaOBSON, MICHAEL G Weeks in Treatment: 1 Active Problems Location of Pain Severity and Description of Pain Patient Has Paino Yes Site Locations Pain Location: Pain in Ulcers With Dressing Change: Yes Duration of the Pain. Constant / Intermittento Intermittent Character of Pain Describe the Pain: Throbbing Pain Management and Medication Current Pain Management: Electronic Signature(s) Signed: 02/09/2018 5:29:35 PM By: Curtis Sitesorthy, Joanna Entered By: Curtis Sitesorthy, Joanna on 02/09/2018 09:23:19 [name], [name] ([id number]) -------------------------------------------------------------------------------- Patient/Caregiver Education Details Patient Name: [name] MassyGARCIA, [name] Date of Service: 02/09/2018 9:00 AM Medical Record Number:  [id number] Patient Account Number: [id number] Date of Birth/Gender: 15-Aug-1970 (47 y.o.  F) Treating RN: Curtis Sites Primary Care Physician: Sandrea Hughs Other Clinician: Referring Physician: Sandrea Hughs Treating Physician/Extender: Altamese [place] in Treatment: 1 Education Assessment Education Provided To: Patient Education Topics Provided Venous: Handouts: Other: leg elevation Methods: Explain/Verbal Responses: State content correctly Electronic Signature(s) Signed: 02/09/2018 5:29:35 PM By: Curtis Sites Entered By: Curtis Sites on 02/09/2018 12:15:40 [name], [name] ([id number]) -------------------------------------------------------------------------------- Wound Assessment Details Patient Name: [name] Date of Service: 02/09/2018 9:00 AM Medical Record Number: [id number] Patient Account Number: [id number] Date of Birth/Sex: [birth year] (47 y.o. F) Treating RN: Curtis Sites Primary Care Adalid Beckmann: Sandrea Hughs Other Clinician: Referring Lasandra Batley: Sandrea Hughs Treating Mansa Willers/Extender: Maxwell Caul Weeks in Treatment: 1 Wound Status Wound Number: 1 Primary Etiology: Venous Leg Ulcer Wound Location: Left Lower Leg - Lateral Wound Status: Open Wounding Event: Gradually Appeared Comorbid History: Type II Diabetes Date Acquired: 01/19/2018 Weeks Of Treatment: 1 Clustered Wound: No Photos Photo Uploaded By: Curtis Sites on 02/09/2018 15:19:47 Wound Measurements Length: (cm) 2.2 Width: (cm) 1.2 Depth: (cm) 0.1 Area: (cm) 2.073 Volume: (cm) 0.207 % Reduction in Area: 23.5% % Reduction in Volume: 23.6% Epithelialization: Small (1-33%) Tunneling: No Undermining: No Wound Description Full Thickness Without Exposed Support Classification: Structures Wound Margin: Flat and Intact Exudate Medium Amount: Exudate Type: Serosanguineous Exudate Color: red, brown Foul Odor After Cleansing: No Slough/Fibrino Yes Wound Bed Granulation Amount: Small (1-33%) Exposed Structure Granulation Quality: Pink Fascia Exposed:  No Necrotic Amount: Large (67-100%) Fat Layer (Subcutaneous Tissue) Exposed: Yes Necrotic Quality: Adherent Slough Tendon Exposed: No Muscle Exposed: No Joint Exposed: No Bone Exposed: No [name], [name] ([id number]) Periwound Skin Texture Texture Color No Abnormalities Noted: No No Abnormalities Noted: No Callus: No Atrophie Blanche: No Crepitus: No Cyanosis: No Excoriation: No Ecchymosis: Yes Induration: No Erythema: No Rash: No Hemosiderin Staining: No Scarring: No Mottled: No Pallor: No Moisture Rubor: No No Abnormalities Noted: No Dry / Scaly: No Temperature / Pain Maceration: No Temperature: No Abnormality Tenderness on Palpation: Yes Wound Preparation Ulcer Cleansing: Rinsed/Irrigated with Saline, Other: soap and water, Topical Anesthetic Applied: Other: lidocaine 4%, Treatment Notes Wound #1 (Left, Lateral Lower Leg) 1. Cleansed with: Cleanse wound with antibacterial soap and water 2. Anesthetic Topical Lidocaine 4% cream to wound bed prior to debridement 4. Dressing Applied: Iodoflex 5. Secondary Dressing Applied ABD Pad 7. Secured with 3 Layer Compression System - Left Lower Extremity Electronic Signature(s) Signed: 02/09/2018 5:29:35 PM By: Curtis Sites Entered By: Curtis Sites on 02/09/2018 09:26:33 [name], [name] ([id number]) -------------------------------------------------------------------------------- Vitals Details Patient Name: [name] Date of Service: 02/09/2018 9:00 AM Medical Record Number: [id number] Patient Account Number: [id number] Date of Birth/Sex: [birth year] (47 y.o. F) Treating RN: Curtis Sites Primary Care Rexine Gowens: Sandrea Hughs Other Clinician: Referring Aliyanah Rozas: Sandrea Hughs Treating Saria Haran/Extender: Altamese [place] in Treatment: 1 Vital Signs Time Taken: 09:23 Temperature (F): 98.0 Height (in): 65 Pulse (bpm): 83 Weight (lbs): 166.8 Respiratory Rate (breaths/min): 16 Body Mass Index (BMI):  27.8 Blood Pressure (mmHg): 123/76 Reference Range: 80 - 120 mg / dl Electronic Signature(s) Signed: 02/09/2018 5:29:35 PM By: Curtis Sites Entered By: Curtis Sites on 02/09/2018 09:23:34

## 2018-02-16 ENCOUNTER — Encounter: Payer: 59 | Admitting: Internal Medicine

## 2018-02-16 DIAGNOSIS — I872 Venous insufficiency (chronic) (peripheral): Secondary | ICD-10-CM | POA: Diagnosis not present

## 2018-02-16 DIAGNOSIS — E1142 Type 2 diabetes mellitus with diabetic polyneuropathy: Secondary | ICD-10-CM | POA: Diagnosis not present

## 2018-02-16 DIAGNOSIS — I87311 Chronic venous hypertension (idiopathic) with ulcer of right lower extremity: Secondary | ICD-10-CM | POA: Diagnosis not present

## 2018-02-16 DIAGNOSIS — L97221 Non-pressure chronic ulcer of left calf limited to breakdown of skin: Secondary | ICD-10-CM | POA: Diagnosis not present

## 2018-02-16 DIAGNOSIS — Z7984 Long term (current) use of oral hypoglycemic drugs: Secondary | ICD-10-CM | POA: Diagnosis not present

## 2018-02-16 DIAGNOSIS — L97222 Non-pressure chronic ulcer of left calf with fat layer exposed: Secondary | ICD-10-CM | POA: Diagnosis not present

## 2018-02-16 DIAGNOSIS — E11622 Type 2 diabetes mellitus with other skin ulcer: Secondary | ICD-10-CM | POA: Diagnosis not present

## 2018-02-22 DIAGNOSIS — E1165 Type 2 diabetes mellitus with hyperglycemia: Secondary | ICD-10-CM | POA: Diagnosis not present

## 2018-02-22 DIAGNOSIS — S81809A Unspecified open wound, unspecified lower leg, initial encounter: Secondary | ICD-10-CM | POA: Diagnosis not present

## 2018-02-23 ENCOUNTER — Encounter: Payer: 59 | Admitting: Internal Medicine

## 2018-02-23 DIAGNOSIS — Z7984 Long term (current) use of oral hypoglycemic drugs: Secondary | ICD-10-CM | POA: Diagnosis not present

## 2018-02-23 DIAGNOSIS — I87312 Chronic venous hypertension (idiopathic) with ulcer of left lower extremity: Secondary | ICD-10-CM | POA: Diagnosis not present

## 2018-02-23 DIAGNOSIS — L97222 Non-pressure chronic ulcer of left calf with fat layer exposed: Secondary | ICD-10-CM | POA: Diagnosis not present

## 2018-02-23 DIAGNOSIS — I87311 Chronic venous hypertension (idiopathic) with ulcer of right lower extremity: Secondary | ICD-10-CM | POA: Diagnosis not present

## 2018-02-23 DIAGNOSIS — E11622 Type 2 diabetes mellitus with other skin ulcer: Secondary | ICD-10-CM | POA: Diagnosis not present

## 2018-02-23 DIAGNOSIS — E1142 Type 2 diabetes mellitus with diabetic polyneuropathy: Secondary | ICD-10-CM | POA: Diagnosis not present

## 2018-02-23 DIAGNOSIS — L97221 Non-pressure chronic ulcer of left calf limited to breakdown of skin: Secondary | ICD-10-CM | POA: Diagnosis not present

## 2018-03-02 ENCOUNTER — Encounter: Payer: 59 | Attending: Nurse Practitioner | Admitting: Nurse Practitioner

## 2018-03-02 DIAGNOSIS — I87312 Chronic venous hypertension (idiopathic) with ulcer of left lower extremity: Secondary | ICD-10-CM | POA: Diagnosis not present

## 2018-03-02 DIAGNOSIS — Z09 Encounter for follow-up examination after completed treatment for conditions other than malignant neoplasm: Secondary | ICD-10-CM | POA: Insufficient documentation

## 2018-03-02 DIAGNOSIS — E1142 Type 2 diabetes mellitus with diabetic polyneuropathy: Secondary | ICD-10-CM | POA: Insufficient documentation

## 2018-03-02 DIAGNOSIS — Z872 Personal history of diseases of the skin and subcutaneous tissue: Secondary | ICD-10-CM | POA: Diagnosis not present

## 2018-03-02 DIAGNOSIS — L97222 Non-pressure chronic ulcer of left calf with fat layer exposed: Secondary | ICD-10-CM | POA: Diagnosis not present

## 2018-03-02 DIAGNOSIS — Z794 Long term (current) use of insulin: Secondary | ICD-10-CM | POA: Insufficient documentation

## 2018-03-06 NOTE — Progress Notes (Signed)
Tinley ParkGARCIA, [name] ([id number]) Visit Report for 02/23/2018 HPI Details Patient Name: [name], Josetta Date of Service: 02/23/2018 3:00 PM Medical Record Number: [id number] Patient Account Number: [id number] Date of Birth/Sex: [birth year] (47 y.o. F) Treating RN: Huel CoventryWoody, Kim Primary Care Provider: Sandrea HughsUBIO, JESSICA Other Clinician: Referring Provider: Sandrea HughsUBIO, JESSICA Treating Provider/Extender: Altamese CarolinaOBSON, MICHAEL G Weeks in Treatment: 3 History of Present Illness HPI Description: ADMISSION 02/02/18 This is a 47 year old woman who is a poorly controlled type 2 diabetes with a recent hemoglobin A1c at the end of June/19 of 13.9. She's had medication adjustments made by her primary physician. I think her compliance with insulin has been poor. She tells me that 2 weeks ago she noticed a burning or stinging sensation on the left lateral leg. She thought something had better and then the area began itching. Her doctor prescribes some form of topical cream. She was also started on cephalexin and perhaps miconazole by her primary physician on July 9. She does not describe claudication. She is not a smoker. She does not have a wound history ABIs in our clinic were 0.9 on the right and 1.09 on the left 02/09/18; wound is about the same in terms of dimensions. Surface looks slightly better this week using Iodoflex under compression. She has poorly controlled diabetes but is working on this. I don't believe she has an arterial issue 02/16/18; it is just above the left mediall malleolus. This is improved. I don't think we need to flex at this point change to collagen 02/23/18;the patient's wound has improved in terms of dimensions. Healthy granulation. Using collagen/Prisma Electronic Signature(s) Signed: 02/23/2018 6:17:43 PM By: Baltazar Najjarobson, Michael MD Entered By: Baltazar Najjarobson, Michael on 02/23/2018 17:22:03 Kovar, Ajna ([id number]) -------------------------------------------------------------------------------- Physical Exam  Details Patient Name: [name], [name] Date of Service: 02/23/2018 3:00 PM Medical Record Number: [id number] Patient Account Number: [id number] Date of Birth/Sex: [birth year] (47 y.o. F) Treating RN: Huel CoventryWoody, Kim Primary Care Provider: Sandrea HughsUBIO, JESSICA Other Clinician: Referring Provider: Sandrea HughsUBIO, JESSICA Treating Provider/Extender: Maxwell CaulOBSON, MICHAEL G Weeks in Treatment: 3 Constitutional Sitting or standing Blood Pressure is within target range for patient.. Pulse regular and within target range for patient.Marland Kitchen. Respirations regular, non-labored and within target range.. Temperature is normal and within the target range for the patient.Marland Kitchen. appears in no distress. Respiratory Respiratory effort is easy and symmetric bilaterally. Rate is normal at rest and on room air.. Cardiovascular Pedal pulses palpable and strong bilaterally.Marland Kitchen. edema control is adequate. Lymphatic none palpable in the popliteal area bilaterally. Integumentary (Hair, Skin) chronic venous insufficiency changes nothing else systemic. Psychiatric No evidence of depression, anxiety, or agitation. Calm, cooperative, and communicative. Appropriate interactions and affect.. Notes wound exam; he is on the left lateral calf just above the lateral malleolus. Healthy-looking tissue nice granulation and improvement in dimensions no surrounding infection Electronic Signature(s) Signed: 02/23/2018 6:17:43 PM By: Baltazar Najjarobson, Michael MD Entered By: Baltazar Najjarobson, Michael on 02/23/2018 17:23:54 Duley, Kelci ([id number]) -------------------------------------------------------------------------------- Physician Orders Details Patient Name: [name], Lilana Date of Service: 02/23/2018 3:00 PM Medical Record Number: [id number] Patient Account Number: [id number] Date of Birth/Sex: [birth year] (47 y.o. F) Treating RN: Huel CoventryWoody, Kim Primary Care Provider: Sandrea HughsUBIO, JESSICA Other Clinician: Referring Provider: Sandrea HughsUBIO, JESSICA Treating Provider/Extender: Altamese CarolinaOBSON, MICHAEL G Weeks  in Treatment: 3 Verbal / Phone Orders: No Diagnosis Coding Wound Cleansing Wound #1 Left,Lateral Lower Leg o Clean wound with Normal Saline. o May shower with protection. Anesthetic (add to Medication List) Wound #1 Left,Lateral Lower Leg o Topical Lidocaine 4% cream applied to wound bed prior to debridement (In Clinic Only). Primary Wound  Dressing Wound #1 Left,Lateral Lower Leg o Silver Collagen Secondary Dressing Wound #1 Left,Lateral Lower Leg o ABD pad Dressing Change Frequency Wound #1 Left,Lateral Lower Leg o Change dressing every week Follow-up Appointments Wound #1 Left,Lateral Lower Leg o Return Appointment in 1 week. o Nurse Visit as needed Edema Control Wound #1 Left,Lateral Lower Leg o 3 Layer Compression System - Left Lower Extremity Electronic Signature(s) Signed: 02/23/2018 6:17:43 PM By: Baltazar Najjar MD Signed: 02/25/2018 6:17:48 PM By: Elliot Gurney, BSN, RN, CWS, Kim RN, BSN Entered By: Elliot Gurney, BSN, RN, CWS, Kim on 02/23/2018 15:51:22 Baylis, Brynn ([id number]) -------------------------------------------------------------------------------- Problem List Details Patient Name: [name] Date of Service: 02/23/2018 3:00 PM Medical Record Number: [id number] Patient Account Number: [id number] Date of Birth/Sex: [birth year] (47 y.o. F) Treating RN: Huel Coventry Primary Care Provider: Sandrea Hughs Other Clinician: Referring Provider: Sandrea Hughs Treating Provider/Extender: Altamese [place] in Treatment: 3 Active Problems ICD-10 Evaluated Encounter Code Description Active Date Today Diagnosis L97.221 Non-pressure chronic ulcer of left calf limited to breakdown of 02/02/2018 No Yes skin I87.311 Chronic venous hypertension (idiopathic) with ulcer of right 02/02/2018 No Yes lower extremity E11.622 Type 2 diabetes mellitus with other skin ulcer 02/02/2018 No Yes E11.42 Type 2 diabetes mellitus with diabetic polyneuropathy 02/02/2018 No  Yes Inactive Problems Resolved Problems Electronic Signature(s) Signed: 02/23/2018 6:17:43 PM By: Baltazar Najjar MD Entered By: Baltazar Najjar on 02/23/2018 17:20:48 Doeden, Violett ([id number]) -------------------------------------------------------------------------------- Progress Note Details Patient Name: [name] Date of Service: 02/23/2018 3:00 PM Medical Record Number: [id number] Patient Account Number: [id number] Date of Birth/Sex: Nov 01, 1970 (47 y.o. F) Treating RN: Huel Coventry Primary Care Provider: Sandrea Hughs Other Clinician: Referring Provider: Sandrea Hughs Treating Provider/Extender: Altamese [place] in Treatment: 3 Subjective History of Present Illness (HPI) ADMISSION 02/02/18 This is a 47 year old woman who is a poorly controlled type 2 diabetes with a recent hemoglobin A1c at the end of June/19 of 13.9. She's had medication adjustments made by her primary physician. I think her compliance with insulin has been poor. She tells me that 2 weeks ago she noticed a burning or stinging sensation on the left lateral leg. She thought something had better and then the area began itching. Her doctor prescribes some form of topical cream. She was also started on cephalexin and perhaps miconazole by her primary physician on July 9. She does not describe claudication. She is not a smoker. She does not have a wound history ABIs in our clinic were 0.9 on the right and 1.09 on the left 02/09/18; wound is about the same in terms of dimensions. Surface looks slightly better this week using Iodoflex under compression. She has poorly controlled diabetes but is working on this. I don't believe she has an arterial issue 02/16/18; it is just above the left mediall malleolus. This is improved. I don't think we need to flex at this point change to collagen 02/23/18;the patient's wound has improved in terms of dimensions. Healthy granulation. Using  collagen/Prisma Objective Constitutional Sitting or standing Blood Pressure is within target range for patient.. Pulse regular and within target range for patient.Marland Kitchen Respirations regular, non-labored and within target range.. Temperature is normal and within the target range for the patient.Marland Kitchen appears in no distress. Vitals Time Taken: 3:08 PM, Height: 65 in, Weight: 166.8 lbs, BMI: 27.8, Temperature: 98.1 F, Pulse: 84 bpm, Respiratory Rate: 16 breaths/min, Blood Pressure: 110/62 mmHg. Respiratory Respiratory effort is easy and symmetric bilaterally. Rate is normal at rest and on room air.. Cardiovascular Pedal pulses palpable and  strong bilaterally.Marland Kitchen edema control is adequate. Lymphatic none palpable in the popliteal area bilaterally. Psychiatric [place], Adhya ([id number]) No evidence of depression, anxiety, or agitation. Calm, cooperative, and communicative. Appropriate interactions and affect.. General Notes: wound exam; he is on the left lateral calf just above the lateral malleolus. Healthy-looking tissue nice granulation and improvement in dimensions no surrounding infection Integumentary (Hair, Skin) chronic venous insufficiency changes nothing else systemic. Wound #1 status is Open. Original cause of wound was Gradually Appeared. The wound is located on the Left,Lateral Lower Leg. The wound measures 1.7cm length x 1cm width x 0.2cm depth; 1.335cm^2 area and 0.267cm^3 volume. There is Fat Layer (Subcutaneous Tissue) Exposed exposed. There is no tunneling or undermining noted. There is a medium amount of serosanguineous drainage noted. The wound margin is flat and intact. There is medium (34-66%) red, pink granulation within the wound bed. There is a medium (34-66%) amount of necrotic tissue within the wound bed including Adherent Slough. The periwound skin appearance exhibited: Ecchymosis. The periwound skin appearance did not exhibit: Callus, Crepitus, Excoriation, Induration, Rash,  Scarring, Dry/Scaly, Maceration, Atrophie Blanche, Cyanosis, Hemosiderin Staining, Mottled, Pallor, Rubor, Erythema. Periwound temperature was noted as No Abnormality. The periwound has tenderness on palpation. 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 There was a Three Layer Compression Therapy Procedure with a pre-treatment ABI of 0.9 by Huel Coventry, RN. Post procedure Diagnosis Wound #1: Same as Pre-Procedure Plan Wound Cleansing: Wound #1 Left,Lateral Lower Leg: Clean wound with Normal Saline. May shower with protection. Anesthetic (add to Medication List): Wound #1 Left,Lateral Lower Leg: Topical Lidocaine 4% cream applied to wound bed prior to debridement (In Clinic Only). Primary Wound Dressing: Stauch, Hina ([id number]) Wound #1 Left,Lateral Lower Leg: Silver Collagen Secondary Dressing: Wound #1 Left,Lateral Lower Leg: ABD pad Dressing Change Frequency: Wound #1 Left,Lateral Lower Leg: Change dressing every week Follow-up Appointments: Wound #1 Left,Lateral Lower Leg: Return Appointment in 1 week. Nurse Visit as needed Edema Control: Wound #1 Left,Lateral Lower Leg: 3 Layer Compression System - Left Lower Extremity #1 continue with silver collagen/ABD/3 alert compression. nice improvement Electronic Signature(s) Signed: 02/23/2018 6:17:43 PM By: Baltazar Najjar MD Entered By: Baltazar Najjar on 02/23/2018 17:24:46 Seidenberg, Colandra ([id number]) -------------------------------------------------------------------------------- SuperBill Details Patient Name: [name] Date of Service: 02/23/2018 Medical Record Number:  [id number] Patient Account Number: [id number] Date of Birth/Sex: 1970-07-30 (47 y.o. F) Treating RN: Huel Coventry Primary Care Provider: Sandrea Hughs Other Clinician: Referring Provider: Sandrea Hughs Treating Provider/Extender: Altamese [place] in Treatment: 3 Diagnosis Coding ICD-10 Codes Code Description [phone] Non-pressure chronic ulcer of left calf limited to breakdown of skin I87.311 Chronic venous hypertension (idiopathic) with ulcer of right lower extremity E11.622 Type 2 diabetes mellitus with other skin ulcer E11.42 Type 2 diabetes mellitus with diabetic polyneuropathy Facility Procedures CPT4 Code: 41324401 Description: (Facility Use Only) [phone] - APPLY MULTLAY COMPRS LWR LT LEG Modifier: Quantity: 1 Physician Procedures CPT4 Code: [id number] Description: 99213 - WC PHYS LEVEL 3 - EST PT ICD-10 Diagnosis Description L97.221 Non-pressure chronic ulcer of left calf limited to breakdown o I87.311 Chronic venous hypertension (idiopathic) with ulcer of right l E11.622 Type 2 diabetes mellitus with  other skin ulcer Modifier: f skin ower  extremity Quantity: 1 Electronic Signature(s) Signed: 02/23/2018 6:17:43 PM By: Baltazar Najjar MD Entered By: Baltazar Najjar on 02/23/2018 17:25:12

## 2018-03-06 NOTE — Progress Notes (Signed)
[place], [name] ([id number]) Visit Report for 02/23/2018 Arrival Information Details Patient Name: [name], [name] Date of Service: 02/23/2018 3:00 PM Medical Record Number: [id number] Patient Account Number: [id number] Date of Birth/Sex: 1970-10-24 (47 y.o. F) Treating RN: Renne Crigler Primary Care Estie Sproule: Sandrea Hughs Other Clinician: Referring Doha Boling: Sandrea Hughs Treating Senai Kingsley/Extender: Altamese [place] in Treatment: 3 Visit Information History Since Last Visit All ordered tests and consults were completed: No Patient Arrived: Ambulatory Added or deleted any medications: No Arrival Time: 15:07 Any new allergies or adverse reactions: No Accompanied By: interpreter Had a fall or experienced change in No Transfer Assistance: None activities of daily living that may affect Patient Identification Verified: Yes risk of falls: Secondary Verification Process Completed: Yes Signs or symptoms of abuse/neglect since last visito No Patient Requires Transmission-Based No Hospitalized since last visit: No Precautions: Implantable device outside of the clinic excluding No Patient Has Alerts: Yes cellular tissue based products placed in the center Patient Alerts: DM II since last visit: Pain Present Now: Yes Electronic Signature(s) Signed: 02/23/2018 5:09:20 PM By: Renne Crigler Entered By: Renne Crigler on 02/23/2018 15:08:09 [name], [name] ([id number]) -------------------------------------------------------------------------------- Compression Therapy Details Patient Name: [name] Date of Service: 02/23/2018 3:00 PM Medical Record Number: [id number] Patient Account Number: [id number] Date of Birth/Sex: [birth year] (47 y.o. F) Treating RN: Huel Coventry Primary Care Fabrizio Filip: Sandrea Hughs Other Clinician: Referring Murl Zogg: Sandrea Hughs Treating Rutger Salton/Extender: Altamese [place] in Treatment: 3 Compression Therapy Performed for Wound Assessment:  Wound #1 Left,Lateral Lower Leg Performed By: Clinician Huel Coventry, RN Compression Type: Three Layer Pre Treatment ABI: 0.9 Post Procedure Diagnosis Same as Pre-procedure Electronic Signature(s) Signed: 02/25/2018 6:17:48 PM By: Elliot Gurney, BSN, RN, CWS, Kim RN, BSN Entered By: Elliot Gurney, BSN, RN, CWS, Kim on 02/23/2018 15:50:33 [place], [name] ([id number]) -------------------------------------------------------------------------------- Encounter Discharge Information Details Patient Name: [name] Date of Service: 02/23/2018 3:00 PM Medical Record Number: [id number] Patient Account Number: [id number] Date of Birth/Sex: 1971-05-31 (47 y.o. F) Treating RN: Curtis Sites Primary Care Krishay Faro: Sandrea Hughs Other Clinician: Referring Urian Martenson: Sandrea Hughs Treating Avianna Moynahan/Extender: Altamese [place] in Treatment: 3 Encounter Discharge Information Items Discharge Condition: Stable Ambulatory Status: Ambulatory Discharge Destination: Home Transportation: Private Auto Accompanied By: mother Schedule Follow-up Appointment: Yes Clinical Summary of Care: Electronic Signature(s) Signed: 02/23/2018 5:05:22 PM By: Curtis Sites Entered By: Curtis Sites on 02/23/2018 17:05:22 [name], [name] ([id number]) -------------------------------------------------------------------------------- Lower Extremity Assessment Details Patient Name: [name] Date of Service: 02/23/2018 3:00 PM Medical Record Number: [id number] Patient Account Number: [id number] Date of Birth/Sex: [birth year] (47 y.o. F) Treating RN: Renne Crigler Primary Care Prince Olivier: Sandrea Hughs Other Clinician: Referring Terrye Dombrosky: Sandrea Hughs Treating Airrion Otting/Extender: Maxwell Caul Weeks in Treatment: 3 Edema Assessment Assessed: [Left: No] [Right: No] Edema: [Left: N] [Right: o] Calf Left: Right: Point of Measurement: 27 cm From Medial Instep 35.5 cm cm Ankle Left: Right: Point of Measurement: 12 cm From Medial  Instep 21.5 cm cm Vascular Assessment Claudication: Claudication Assessment [Left:None] Pulses: Dorsalis Pedis Palpable: [Left:Yes] Posterior Tibial Extremity colors, hair growth, and conditions: Extremity Color: [Left:Normal] Hair Growth on Extremity: [Left:Yes] Temperature of Extremity: [Left:Warm] Capillary Refill: [Left:< 3 seconds] Toe Nail Assessment Left: Right: Thick: No Discolored: No Deformed: No Improper Length and Hygiene: No Electronic Signature(s) Signed: 02/23/2018 5:09:20 PM By: Renne Crigler Entered By: Renne Crigler on 02/23/2018 15:19:52 [name], [name] ([id number]) -------------------------------------------------------------------------------- Multi Wound Chart Details Patient Name: [name] Date of Service: 02/23/2018 3:00 PM Medical Record Number: [id number] Patient Account Number: [id number] Date of Birth/Sex: [birth year] (46  y.o. F) Treating RN: Huel Coventry Primary Care Cheria Sadiq: Sandrea Hughs Other Clinician: Referring Dimples Probus: Sandrea Hughs Treating Jishnu Jenniges/Extender: Altamese [place] in Treatment: 3 Vital Signs Height(in): 65 Pulse(bpm): 84 Weight(lbs): 166.8 Blood Pressure(mmHg): 110/62 Body Mass Index(BMI): 28 Temperature(F): 98.1 Respiratory Rate 16 (breaths/min): Photos: [N/A:N/A] Wound Location: Left Lower Leg - Lateral N/A N/A Wounding Event: Gradually Appeared N/A N/A Primary Etiology: Venous Leg Ulcer N/A N/A Comorbid History: Type II Diabetes N/A N/A Date Acquired: 01/19/2018 N/A N/A Weeks of Treatment: 3 N/A N/A Wound Status: Open N/A N/A Measurements L x W x D 1.7x1x0.2 N/A N/A (cm) Area (cm) : 1.335 N/A N/A Volume (cm) : 0.267 N/A N/A % Reduction in Area: 50.70% N/A N/A % Reduction in Volume: 1.50% N/A N/A Classification: Full Thickness Without N/A N/A Exposed Support Structures Exudate Amount: Medium N/A N/A Exudate Type: Serosanguineous N/A N/A Exudate Color: red, brown N/A N/A Wound Margin: Flat  and Intact N/A N/A Granulation Amount: Medium (34-66%) N/A N/A Granulation Quality: Red, Pink N/A N/A Necrotic Amount: Medium (34-66%) N/A N/A Exposed Structures: Fat Layer (Subcutaneous N/A N/A Tissue) Exposed: Yes Fascia: No Tendon: No Muscle: No Joint: No Bone: No Schrag, Bonnye ([id number]) Epithelialization: Small (1-33%) N/A N/A Periwound Skin Texture: Excoriation: No N/A N/A Induration: No Callus: No Crepitus: No Rash: No Scarring: No Periwound Skin Moisture: Maceration: No N/A N/A Dry/Scaly: No Periwound Skin Color: Ecchymosis: Yes N/A N/A Atrophie Blanche: No Cyanosis: No Erythema: No Hemosiderin Staining: No Mottled: No Pallor: No Rubor: No Temperature: No Abnormality N/A N/A Tenderness on Palpation: Yes N/A N/A Wound Preparation: Ulcer Cleansing: N/A N/A Rinsed/Irrigated with Saline, Other: soap and water Topical Anesthetic Applied: Other: lidocaine 4% Procedures Performed: Compression Therapy N/A N/A Treatment Notes Wound #1 (Left, Lateral Lower Leg) 1. Cleansed with: Clean wound with Normal Saline 2. Anesthetic Topical Lidocaine 4% cream to wound bed prior to debridement 3. Peri-wound Care: Moisturizing lotion 4. Dressing Applied: Prisma Ag 5. Secondary Dressing Applied ABD Pad 7. Secured with 3 Layer Compression System - Left Lower Extremity Electronic Signature(s) Signed: 02/23/2018 6:17:43 PM By: Baltazar Najjar MD Entered By: Baltazar Najjar on 02/23/2018 17:21:04 [name], [name] ([id number]) -------------------------------------------------------------------------------- Multi-Disciplinary Care Plan Details Patient Name: [name] Date of Service: 02/23/2018 3:00 PM Medical Record Number: [id number] Patient Account Number: [id number] Date of Birth/Sex: 1971/04/14 (47 y.o. F) Treating RN: Huel Coventry Primary Care Willford Rabideau: Sandrea Hughs Other Clinician: Referring Westyn Keatley: Sandrea Hughs Treating Babacar Haycraft/Extender: Altamese [place]  in Treatment: 3 Active Inactive ` Orientation to the Wound Care Program Nursing Diagnoses: Knowledge deficit related to the wound healing center program Goals: Patient/caregiver will verbalize understanding of the Wound Healing Center Program Date Initiated: 02/02/2018 Target Resolution Date: 03/02/2018 Goal Status: Active Interventions: Provide education on orientation to the wound center Notes: ` Wound/Skin Impairment Nursing Diagnoses: Impaired tissue integrity Goals: Patient/caregiver will verbalize understanding of skin care regimen Date Initiated: 02/02/2018 Target Resolution Date: 03/02/2018 Goal Status: Active Ulcer/skin breakdown will have a volume reduction of 30% by week 4 Date Initiated: 02/02/2018 Target Resolution Date: 03/02/2018 Goal Status: Active Interventions: Assess patient/caregiver ability to obtain necessary supplies Assess ulceration(s) every visit Treatment Activities: Skin care regimen initiated : 02/02/2018 Notes: Electronic Signature(s) Signed: 02/25/2018 6:17:48 PM By: Elliot Gurney, BSN, RN, CWS, Kim RN, BSN Entered By: Elliot Gurney, BSN, RN, CWS, Kim on 02/23/2018 15:49:40 [name], [name] ([id number]) [name], [name] ([id number]) -------------------------------------------------------------------------------- Pain Assessment Details Patient Name: [name] Date of Service: 02/23/2018 3:00 PM Medical Record Number: [id number] Patient Account Number: [id number] Date of Birth/Sex:  August 08, 1970 (46 y.o. F) Treating RN: Renne Crigler Primary Care Meighan Treto: Sandrea Hughs Other Clinician: Referring Juanya Villavicencio: Sandrea Hughs Treating Harmony Sandell/Extender: Maxwell Caul Weeks in Treatment: 3 Active Problems Location of Pain Severity and Description of Pain Patient Has Paino Yes Site Locations Rate the pain. Current Pain Level: 5 Pain Management and Medication Current Pain Management: Electronic Signature(s) Signed: 02/23/2018 5:09:20 PM By: Renne Crigler Entered By:  Renne Crigler on 02/23/2018 15:08:25 [name], [name] ([id number]) -------------------------------------------------------------------------------- Patient/Caregiver Education Details Patient Name: [name] Date of Service: 02/23/2018 3:00 PM Medical Record Number: [id number] Patient Account Number: [id number] Date of Birth/Gender: [birth year] (47 y.o. F) Treating RN: Curtis Sites Primary Care Physician: Sandrea Hughs Other Clinician: Referring Physician: Sandrea Hughs Treating Physician/Extender: Altamese [place] in Treatment: 3 Education Assessment Education Provided To: Patient Education Topics Provided Venous: Handouts: Other: ankle rolls Methods: Explain/Verbal Responses: State content correctly Electronic Signature(s) Signed: 02/23/2018 5:29:24 PM By: Curtis Sites Entered By: Curtis Sites on 02/23/2018 17:09:48 [name], [name] ([id number]) -------------------------------------------------------------------------------- Wound Assessment Details Patient Name: [name] Date of Service: 02/23/2018 3:00 PM Medical Record Number: [id number] Patient Account Number: [id number] Date of Birth/Sex: 05/07/1971 (47 y.o. F) Treating RN: Renne Crigler Primary Care Kaleo Condrey: Sandrea Hughs Other Clinician: Referring Tashira Torre: Sandrea Hughs Treating Euleta Belson/Extender: Maxwell Caul Weeks in Treatment: 3 Wound Status Wound Number: 1 Primary Etiology: Venous Leg Ulcer Wound Location: Left Lower Leg - Lateral Wound Status: Open Wounding Event: Gradually Appeared Comorbid History: Type II Diabetes Date Acquired: 01/19/2018 Weeks Of Treatment: 3 Clustered Wound: No Photos Photo Uploaded By: Renne Crigler on 02/23/2018 16:29:56 Wound Measurements Length: (cm) 1.7 Width: (cm) 1 Depth: (cm) 0.2 Area: (cm) 1.335 Volume: (cm) 0.267 % Reduction in Area: 50.7% % Reduction in Volume: 1.5% Epithelialization: Small (1-33%) Tunneling: No Undermining: No Wound  Description Full Thickness Without Exposed Support Classification: Structures Wound Margin: Flat and Intact Exudate Medium Amount: Exudate Type: Serosanguineous Exudate Color: red, brown Foul Odor After Cleansing: No Slough/Fibrino Yes Wound Bed Granulation Amount: Medium (34-66%) Exposed Structure Granulation Quality: Red, Pink Fascia Exposed: No Necrotic Amount: Medium (34-66%) Fat Layer (Subcutaneous Tissue) Exposed: Yes Necrotic Quality: Adherent Slough Tendon Exposed: No Muscle Exposed: No Joint Exposed: No Bone Exposed: No [name], [name] ([id number]) Periwound Skin Texture Texture Color No Abnormalities Noted: No No Abnormalities Noted: No Callus: No Atrophie Blanche: No Crepitus: No Cyanosis: No Excoriation: No Ecchymosis: Yes Induration: No Erythema: No Rash: No Hemosiderin Staining: No Scarring: No Mottled: No Pallor: No Moisture Rubor: No No Abnormalities Noted: No Dry / Scaly: No Temperature / Pain Maceration: No Temperature: No Abnormality Tenderness on Palpation: Yes Wound Preparation Ulcer Cleansing: Rinsed/Irrigated with Saline, Other: soap and water, Topical Anesthetic Applied: Other: lidocaine 4%, Treatment Notes Wound #1 (Left, Lateral Lower Leg) 1. Cleansed with: Clean wound with Normal Saline 2. Anesthetic Topical Lidocaine 4% cream to wound bed prior to debridement 3. Peri-wound Care: Moisturizing lotion 4. Dressing Applied: Prisma Ag 5. Secondary Dressing Applied ABD Pad 7. Secured with 3 Layer Compression System - Left Lower Extremity Electronic Signature(s) Signed: 02/23/2018 5:09:20 PM By: Renne Crigler Entered By: Renne Crigler on 02/23/2018 15:17:14 [name], [name] ([id number]) -------------------------------------------------------------------------------- Vitals Details Patient Name: [name] Date of Service: 02/23/2018 3:00 PM Medical Record Number: [id number] Patient Account Number: [id number] Date of  Birth/Sex: Sep 27, 1970 (47 y.o. F) Treating RN: Renne Crigler Primary Care Shiza Thelen: Sandrea Hughs Other Clinician: Referring Calianne Larue: Sandrea Hughs Treating Yuji Walth/Extender: Maxwell Caul Weeks in Treatment: 3 Vital Signs Time Taken: 15:08 Temperature (F): 98.1 Height (in): 65  Pulse (bpm): 84 Weight (lbs): 166.8 Respiratory Rate (breaths/min): 16 Body Mass Index (BMI): 27.8 Blood Pressure (mmHg): 110/62 Reference Range: 80 - 120 mg / dl Electronic Signature(s) Signed: 02/23/2018 5:09:20 PM By: Renne CriglerFlinchum, [name] Entered By: Renne CriglerFlinchum, [name] on 02/23/2018 15:10:17

## 2018-03-09 ENCOUNTER — Encounter: Payer: 59 | Admitting: Nurse Practitioner

## 2018-03-09 DIAGNOSIS — L97222 Non-pressure chronic ulcer of left calf with fat layer exposed: Secondary | ICD-10-CM | POA: Diagnosis not present

## 2018-03-09 DIAGNOSIS — Z872 Personal history of diseases of the skin and subcutaneous tissue: Secondary | ICD-10-CM | POA: Diagnosis not present

## 2018-03-09 DIAGNOSIS — Z09 Encounter for follow-up examination after completed treatment for conditions other than malignant neoplasm: Secondary | ICD-10-CM | POA: Diagnosis not present

## 2018-03-09 DIAGNOSIS — I87311 Chronic venous hypertension (idiopathic) with ulcer of right lower extremity: Secondary | ICD-10-CM | POA: Diagnosis not present

## 2018-03-09 DIAGNOSIS — Z794 Long term (current) use of insulin: Secondary | ICD-10-CM | POA: Diagnosis not present

## 2018-03-09 DIAGNOSIS — E1142 Type 2 diabetes mellitus with diabetic polyneuropathy: Secondary | ICD-10-CM | POA: Diagnosis not present

## 2018-03-13 NOTE — Progress Notes (Signed)
[place] Hill[place]ARC[place], [name] ([id number]) Visit Report for 03/02/2018 Chief Complaint Document Details Patient Name: [name], [name] Date of Service: 03/02/2018 1:30 PM Medical Record Number: [id number] Patient Account Number: [id number] Date of Birth/Sex: Oct 22, 1970 (47 y.o. F) Treating RN: Huel CoventryWoody, Kim Primary Care Provider: Sandrea HughsUBIO, JESSICA Other Clinician: Referring Provider: Sandrea HughsUBIO, JESSICA Treating Provider/Extender: Kathreen Cosieroulter, Lakara Weiland Weeks in Treatment: 4 Information Obtained from: Patient Chief Complaint patient is here for review of wound on her left lateral calf distally Electronic Signature(s) Signed: 03/02/2018 2:45:34 PM By: Bonnell Publicoulter, Bryn Saline Entered By: Bonnell Publicoulter, Kinte Trim on 03/02/2018 14:45:34 Emmanuel, Vinaya ([id number]) -------------------------------------------------------------------------------- Debridement Details Patient Name: [name], [name] Date of Service: 03/02/2018 1:30 PM Medical Record Number: [id number] Patient Account Number: [id number] Date of Birth/Sex: Oct 22, 1970 (47 y.o. F) Treating RN: Huel CoventryWoody, Kim Primary Care Provider: Sandrea HughsUBIO, JESSICA Other Clinician: Referring Provider: Sandrea HughsUBIO, JESSICA Treating Provider/Extender: Kathreen Cosieroulter, Chaniah Cisse Weeks in Treatment: 4 Debridement Performed for Wound #1 Left,Lateral Lower Leg Assessment: Performed By: Physician Bonnell Publicoulter, Tiffnay Bossi, NP Debridement Type: Debridement Severity of Tissue Pre Fat layer exposed Debridement: Pre-procedure Verification/Time Yes - 14:08 Out Taken: Start Time: 14:09 Pain Control: Other : lidocaine 4% Total Area Debrided (L x W): 1.1 (cm) x 0.7 (cm) = 0.77 (cm) Tissue and other material Viable, Non-Viable, Eschar, Slough, Subcutaneous, Fibrin/Exudate, Slough debrided: Level: Skin/Subcutaneous Tissue Debridement Description: Excisional Instrument: Blade Bleeding: Minimum Hemostasis Achieved: Pressure Response to Treatment: Procedure was tolerated well Level of Consciousness: Awake and Alert Post Debridement Measurements of Total  Wound Length: (cm) 1.4 Width: (cm) 0.7 Depth: (cm) 0.1 Volume: (cm) 0.077 Character of Wound/Ulcer Post Debridement: Stable Severity of Tissue Post Debridement: Fat layer exposed Post Procedure Diagnosis Same as Pre-procedure Electronic Signature(s) Signed: 03/02/2018 2:45:08 PM By: Bonnell Publicoulter, Rjay Revolorio Signed: 03/02/2018 5:11:54 PM By: Elliot GurneyWoody, BSN, RN, CWS, Kim RN, BSN Entered By: Bonnell Publicoulter, Chelcey Caputo on 03/02/2018 14:45:08 Mundorf, Jelesa ([id number]) -------------------------------------------------------------------------------- HPI Details Patient Name: [name], [name] Date of Service: 03/02/2018 1:30 PM Medical Record Number: [id number] Patient Account Number: [id number] Date of Birth/Sex: Oct 22, 1970 (47 y.o. F) Treating RN: Huel CoventryWoody, Kim Primary Care Provider: Sandrea HughsUBIO, JESSICA Other Clinician: Referring Provider: Sandrea HughsUBIO, JESSICA Treating Provider/Extender: Kathreen Cosieroulter, Shaneque Merkle Weeks in Treatment: 4 History of Present Illness HPI Description: ADMISSION 02/02/18 This is a 47 year old woman who is a poorly controlled type 2 diabetes with a recent hemoglobin A1c at the end of June/19 of 13.9. She's had medication adjustments made by her primary physician. I think her compliance with insulin has been poor. She tells me that 2 weeks ago she noticed a burning or stinging sensation on the left lateral leg. She thought something had better and then the area began itching. Her doctor prescribes some form of topical cream. She was also started on cephalexin and perhaps miconazole by her primary physician on July 9. She does not describe claudication. She is not a smoker. She does not have a wound history ABIs in our clinic were 0.9 on the right and 1.09 on the left 02/09/18; wound is about the same in terms of dimensions. Surface looks slightly better this week using Iodoflex under compression. She has poorly controlled diabetes but is working on this. I don't believe she has an arterial issue 02/16/18; it is just above the  left mediall malleolus. This is improved. I don't think we need to flex at this point change to collagen 02/23/18;the patient's wound has improved in terms of dimensions. Healthy granulation. Using collagen/Prisma 03/02/18-She is seen in follow-up evaluation for a left lateral lower extremity wound. There is improvement. We will continue with collagen and compression wrap  and she'll be seen next week Electronic Signature(s) Signed: 03/02/2018 2:47:29 PM By: Bonnell Publicoulter, Everett Ehrler Entered By: Bonnell Publicoulter, Latravia Southgate on 03/02/2018 14:47:29 Bargar, Kailee ([id number]) -------------------------------------------------------------------------------- Physician Orders Details Patient Name: [name], [name] Date of Service: 03/02/2018 1:30 PM Medical Record Number: [id number] Patient Account Number: [id number] Date of Birth/Sex: Jan 06, 1971 (47 y.o. F) Treating RN: Huel CoventryWoody, Kim Primary Care Provider: Sandrea HughsUBIO, JESSICA Other Clinician: Referring Provider: Sandrea HughsUBIO, JESSICA Treating Provider/Extender: Kathreen Cosieroulter, Zuleyma Scharf Weeks in Treatment: 4 Verbal / Phone Orders: No Diagnosis Coding Wound Cleansing Wound #1 Left,Lateral Lower Leg o Clean wound with Normal Saline. o May shower with protection. Anesthetic (add to Medication List) Wound #1 Left,Lateral Lower Leg o Topical Lidocaine 4% cream applied to wound bed prior to debridement (In Clinic Only). Primary Wound Dressing Wound #1 Left,Lateral Lower Leg o Silver Collagen Secondary Dressing Wound #1 Left,Lateral Lower Leg o ABD pad Dressing Change Frequency Wound #1 Left,Lateral Lower Leg o Change dressing every week Follow-up Appointments Wound #1 Left,Lateral Lower Leg o Return Appointment in 1 week. o Nurse Visit as needed Edema Control Wound #1 Left,Lateral Lower Leg o 3 Layer Compression System - Left Lower Extremity Electronic Signature(s) Signed: 03/02/2018 4:31:15 PM By: Bonnell Publicoulter, Derita Michelsen Signed: 03/02/2018 5:11:54 PM By: Elliot GurneyWoody, BSN, RN, CWS, Kim RN,  BSN Entered By: Elliot GurneyWoody, BSN, RN, CWS, Kim on 03/02/2018 14:12:06 [place], Samanda ([id number]) -------------------------------------------------------------------------------- Problem List Details Patient Name: [name], [name] Date of Service: 03/02/2018 1:30 PM Medical Record Number: [id number] Patient Account Number: [id number] Date of Birth/Sex: Jan 06, 1971 (47 y.o. F) Treating RN: Huel CoventryWoody, Kim Primary Care Provider: Sandrea HughsUBIO, JESSICA Other Clinician: Referring Provider: Sandrea HughsUBIO, JESSICA Treating Provider/Extender: Kathreen Cosieroulter, Meline Russaw Weeks in Treatment: 4 Active Problems ICD-10 Evaluated Encounter Code Description Active Date Today Diagnosis L97.222 Non-pressure chronic ulcer of left calf with fat layer exposed 02/02/2018 No Yes I87.311 Chronic venous hypertension (idiopathic) with ulcer of right 02/02/2018 No Yes lower extremity E11.622 Type 2 diabetes mellitus with other skin ulcer 02/02/2018 No Yes E11.42 Type 2 diabetes mellitus with diabetic polyneuropathy 02/02/2018 No Yes Inactive Problems Resolved Problems Electronic Signature(s) Signed: 03/02/2018 2:44:44 PM By: Bonnell Publicoulter, Creola Krotz Entered By: Bonnell Publicoulter, Zoltan Genest on 03/02/2018 14:44:44 Eggebrecht, Sharine ([id number]) -------------------------------------------------------------------------------- Progress Note Details Patient Name: [name], [name] Date of Service: 03/02/2018 1:30 PM Medical Record Number: [id number] Patient Account Number: [id number] Date of Birth/Sex: Jan 06, 1971 (47 y.o. F) Treating RN: Huel CoventryWoody, Kim Primary Care Provider: Sandrea HughsUBIO, JESSICA Other Clinician: Referring Provider: Sandrea HughsUBIO, JESSICA Treating Provider/Extender: Kathreen Cosieroulter, Xcaret Morad Weeks in Treatment: 4 Subjective Chief Complaint Information obtained from Patient patient is here for review of wound on her left lateral calf distally History of Present Illness (HPI) ADMISSION 02/02/18 This is a 47 year old woman who is a poorly controlled type 2 diabetes with a recent hemoglobin A1c at the end of  June/19 of 13.9. She's had medication adjustments made by her primary physician. I think her compliance with insulin has been poor. She tells me that 2 weeks ago she noticed a burning or stinging sensation on the left lateral leg. She thought something had better and then the area began itching. Her doctor prescribes some form of topical cream. She was also started on cephalexin and perhaps miconazole by her primary physician on July 9. She does not describe claudication. She is not a smoker. She does not have a wound history ABIs in our clinic were 0.9 on the right and 1.09 on the left 02/09/18; wound is about the same in terms of dimensions. Surface looks slightly better this week using Iodoflex under compression. She has poorly controlled diabetes  but is working on this. I don't believe she has an arterial issue 02/16/18; it is just above the left mediall malleolus. This is improved. I don't think we need to flex at this point change to collagen 02/23/18;the patient's wound has improved in terms of dimensions. Healthy granulation. Using collagen/Prisma 03/02/18-She is seen in follow-up evaluation for a left lateral lower extremity wound. There is improvement. We will continue with collagen and compression wrap and she'll be seen next week Objective Constitutional Vitals Time Taken: 1:55 PM, Height: 65 in, Weight: 166.8 lbs, BMI: 27.8, Temperature: 98.1 F, Pulse: 93 bpm, Respiratory Rate: 16 breaths/min, Blood Pressure: 104/62 mmHg. Integumentary (Hair, Skin) Wound #1 status is Open. Original cause of wound was Gradually Appeared. The wound is located on the Left,Lateral Lower Leg. The wound measures 1.4cm length x 0.7cm width x 0.1cm depth; 0.77cm^2 area and 0.077cm^3 volume. There is Fat Layer (Subcutaneous Tissue) Exposed exposed. There is no tunneling or undermining noted. There is a medium amount of serosanguineous drainage noted. The wound margin is flat and intact. There is medium  (34-66%) red, pink granulation within the wound bed. There is a medium (34-66%) amount of necrotic tissue within the wound bed including Adherent Slough. The periwound skin appearance exhibited: Ecchymosis. The periwound skin appearance did not exhibit: Callus, Crepitus, Kreiter, Vesna ([id number]) Excoriation, Induration, Rash, Scarring, Dry/Scaly, Maceration, Atrophie Blanche, Cyanosis, Hemosiderin Staining, Mottled, Pallor, Rubor, Erythema. Periwound temperature was noted as No Abnormality. The periwound has tenderness on palpation. Assessment Active Problems ICD-10 Non-pressure chronic ulcer of left calf with fat layer exposed Chronic venous hypertension (idiopathic) with ulcer of right lower extremity Type 2 diabetes mellitus with other skin ulcer Type 2 diabetes mellitus with diabetic polyneuropathy Procedures Wound #1 Pre-procedure diagnosis of Wound #1 is a Venous Leg Ulcer located on the Left,Lateral Lower Leg .Severity of Tissue Pre Debridement is: Fat layer exposed. There was a Excisional Skin/Subcutaneous Tissue Debridement with a total area of 0.77 sq cm performed by Bonnell Public, NP. With the following instrument(s): Blade to remove Viable and Non-Viable tissue/material. Material removed includes Eschar, Subcutaneous Tissue, Slough, and Fibrin/Exudate after achieving pain control using Other (lidocaine 4%). A time out was conducted at 14:08, prior to the start of the procedure. A Minimum amount of bleeding was controlled with Pressure. The procedure was tolerated well. Patient s Level of Consciousness post procedure was recorded as Awake and Alert. Post Debridement Measurements: 1.4cm length x 0.7cm width x 0.1cm depth; 0.077cm^3 volume. Character of Wound/Ulcer Post Debridement is stable. Severity of Tissue Post Debridement is: Fat layer exposed. Post procedure Diagnosis Wound #1: Same as Pre-Procedure Plan Wound Cleansing: Wound #1 Left,Lateral Lower Leg: Clean wound with  Normal Saline. May shower with protection. Anesthetic (add to Medication List): Wound #1 Left,Lateral Lower Leg: Topical Lidocaine 4% cream applied to wound bed prior to debridement (In Clinic Only). Primary Wound Dressing: Wound #1 Left,Lateral Lower Leg: Silver Collagen Secondary Dressing: Wound #1 Left,Lateral Lower Leg: ABD pad Dressing Change Frequency: Wound #1 Left,Lateral Lower Leg: Change dressing every week Saenz, Yanel ([id number]) Follow-up Appointments: Wound #1 Left,Lateral Lower Leg: Return Appointment in 1 week. Nurse Visit as needed Edema Control: Wound #1 Left,Lateral Lower Leg: 3 Layer Compression System - Left Lower Extremity Electronic Signature(s) Signed: 03/02/2018 2:47:38 PM By: Bonnell Public Entered By: Bonnell Public on 03/02/2018 14:47:38 Morimoto, Deshannon ([id number]) -------------------------------------------------------------------------------- SuperBill Details Patient Name: [name] Massy Date of Service: 03/02/2018 Medical Record Number: [id number] Patient Account Number: [id number] Date of Birth/Sex: 02-26-1971 (47 y.o. F)  Treating RN: Huel Coventry Primary Care Provider: Sandrea Hughs Other Clinician: Referring Provider: Sandrea Hughs Treating Provider/Extender: Kathreen Cosier in Treatment: 4 Diagnosis Coding ICD-10 Codes Code Description [phone] Non-pressure chronic ulcer of left calf with fat layer exposed I87.311 Chronic venous hypertension (idiopathic) with ulcer of right lower extremity E11.622 Type 2 diabetes mellitus with other skin ulcer E11.42 Type 2 diabetes mellitus with diabetic polyneuropathy Facility Procedures CPT4 Code: [id number] Description: 11042 - DEB SUBQ TISSUE 20 SQ CM/< ICD-10 Diagnosis Description L97.222 Non-pressure chronic ulcer of left calf with fat layer expo Modifier: sed Quantity: 1 Physician Procedures CPT4 Code: 3244010 Description: 11042 - WC PHYS SUBQ TISS 20 SQ CM ICD-10 Diagnosis Description L97.222  Non-pressure chronic ulcer of left calf with fat layer expo Modifier: sed Quantity: 1 Electronic Signature(s) Signed: 03/02/2018 2:47:54 PM By: Bonnell Public Entered By: Bonnell Public on 03/02/2018 14:47:53

## 2018-03-14 NOTE — Progress Notes (Signed)
[place]IA, [name] ([id number]) Visit Report for 03/02/2018 Arrival Information Details Patient Name: [name], [name] Date of Service: 03/02/2018 1:30 PM Medical Record Number: [id number] Patient Account Number: [id number] Date of Birth/Sex: 1971-05-22 (47 y.o. F) Treating RN: Curtis Sitesorthy, Joanna Primary Care Eustacia Urbanek: Sandrea HughsUBIO, JESSICA Other Clinician: Referring Calib Wadhwa: Sandrea HughsUBIO, JESSICA Treating Lashanna Angelo/Extender: Kathreen Cosieroulter, Leah Weeks in Treatment: 4 Visit Information History Since Last Visit Added or deleted any medications: No Patient Arrived: Ambulatory Any new allergies or adverse reactions: No Arrival Time: 13:52 Had a fall or experienced change in No Accompanied By: translator activities of daily living that may affect Transfer Assistance: None risk of falls: Patient Identification Verified: Yes Signs or symptoms of abuse/neglect since last visito No Secondary Verification Process Completed: Yes Hospitalized since last visit: No Patient Requires Transmission-Based No Implantable device outside of the clinic excluding No Precautions: cellular tissue based products placed in the center Patient Has Alerts: Yes since last visit: Patient Alerts: DM II Has Dressing in Place as Prescribed: Yes Has Compression in Place as Prescribed: Yes Pain Present Now: No Electronic Signature(s) Signed: 03/02/2018 3:43:39 PM By: Curtis Sitesorthy, Joanna Entered By: Curtis Sitesorthy, Joanna on 03/02/2018 13:52:31 Silguero, [name] ([id number]) -------------------------------------------------------------------------------- Encounter Discharge Information Details Patient Name: [name], [name] Date of Service: 03/02/2018 1:30 PM Medical Record Number: [id number] Patient Account Number: [id number] Date of Birth/Sex: 1971-05-22 (47 y.o. F) Treating RN: Phillis HaggisPinkerton, Debi Primary Care Candis Kabel: Sandrea HughsUBIO, JESSICA Other Clinician: Referring Tiari Andringa: Sandrea HughsUBIO, JESSICA Treating Samiya Mervin/Extender: Kathreen Cosieroulter, Leah Weeks in Treatment: 4 Encounter Discharge  Information Items Discharge Condition: Stable Ambulatory Status: Ambulatory Discharge Destination: Home Transportation: Private Auto Accompanied By: interpretor Schedule Follow-up Appointment: Yes Clinical Summary of Care: Electronic Signature(s) Signed: 03/03/2018 4:46:33 PM By: Alejandro MullingPinkerton, Debra Entered By: Alejandro MullingPinkerton, Debra on 03/02/2018 14:27:50 [place], [name] ([id number]) -------------------------------------------------------------------------------- Lower Extremity Assessment Details Patient Name: [name], [name] Date of Service: 03/02/2018 1:30 PM Medical Record Number: [id number] Patient Account Number: [id number] Date of Birth/Sex: 1971-05-22 (47 y.o. F) Treating RN: Curtis Sitesorthy, Joanna Primary Care Zia Najera: Sandrea HughsUBIO, JESSICA Other Clinician: Referring Jah Alarid: Sandrea HughsUBIO, JESSICA Treating Rose Hippler/Extender: Kathreen Cosieroulter, Leah Weeks in Treatment: 4 Edema Assessment Assessed: [Left: No] [Right: No] [Left: Edema] [Right: :] Calf Left: Right: Point of Measurement: 27 cm From Medial Instep 36 cm cm Ankle Left: Right: Point of Measurement: 12 cm From Medial Instep 20.9 cm cm Vascular Assessment Pulses: Dorsalis Pedis Palpable: [Left:Yes] Posterior Tibial Extremity colors, hair growth, and conditions: Extremity Color: [Left:Normal] Hair Growth on Extremity: [Left:Yes] Temperature of Extremity: [Left:Warm] Capillary Refill: [Left:< 3 seconds] Toe Nail Assessment Left: Right: Thick: Yes Discolored: No Deformed: No Improper Length and Hygiene: No Electronic Signature(s) Signed: 03/02/2018 3:43:39 PM By: Curtis Sitesorthy, Joanna Entered By: Curtis Sitesorthy, Joanna on 03/02/2018 13:58:11 Mcinerney, [name] ([id number]) -------------------------------------------------------------------------------- Multi Wound Chart Details Patient Name: [name], [name] Date of Service: 03/02/2018 1:30 PM Medical Record Number: [id number] Patient Account Number: [id number] Date of Birth/Sex: 1971-05-22 (47 y.o. F) Treating RN: Huel CoventryWoody,  Kim Primary Care Jeneen Doutt: Sandrea HughsUBIO, JESSICA Other Clinician: Referring Zarai Orsborn: Sandrea HughsUBIO, JESSICA Treating Deanie Jupiter/Extender: Kathreen Cosieroulter, Leah Weeks in Treatment: 4 Vital Signs Height(in): 65 Pulse(bpm): 93 Weight(lbs): 166.8 Blood Pressure(mmHg): 104/62 Body [name] Index(BMI): 28 Temperature(F): 98.1 Respiratory Rate 16 (breaths/min): Photos: [1:No Photos] [N/A:N/A] Wound Location: [1:Left Lower Leg - Lateral] [N/A:N/A] Wounding Event: [1:Gradually Appeared] [N/A:N/A] Primary Etiology: [1:Venous Leg Ulcer] [N/A:N/A] Comorbid History: [1:Type II Diabetes] [N/A:N/A] Date Acquired: [1:01/19/2018] [N/A:N/A] Weeks of Treatment: [1:4] [N/A:N/A] Wound Status: [1:Open] [N/A:N/A] Measurements L x W x D [1:1.4x0.7x0.1] [N/A:N/A] (cm) Area (cm) : [1:0.77] [N/A:N/A] Volume (cm) : [1:0.077] [N/A:N/A] % Reduction in Area: [1:71.60%] [N/A:N/A] %  Reduction in Volume: [1:71.60%] [N/A:N/A] Classification: [1:Full Thickness Without Exposed Support Structures] [N/A:N/A] Exudate Amount: [1:Medium] [N/A:N/A] Exudate Type: [1:Serosanguineous] [N/A:N/A] Exudate Color: [1:red, brown] [N/A:N/A] Wound Margin: [1:Flat and Intact] [N/A:N/A] Granulation Amount: [1:Medium (34-66%)] [N/A:N/A] Granulation Quality: [1:Red, Pink] [N/A:N/A] Necrotic Amount: [1:Medium (34-66%)] [N/A:N/A] Exposed Structures: [1:Fat Layer (Subcutaneous Tissue) Exposed: Yes Fascia: No Tendon: No Muscle: No Joint: No Bone: No] [N/A:N/A] Epithelialization: [1:Small (1-33%)] [N/A:N/A] Debridement: [1:Debridement - Excisional] [N/A:N/A] Pre-procedure [1:14:08] [N/A:N/A] Verification/Time Out Taken: Pain Control: [1:Other] [N/A:N/A] Tissue Debrided: [1:Necrotic/Eschar, Subcutaneous] [N/A:N/A] Level: Skin/Subcutaneous Tissue N/A N/A Debridement Area (sq cm): 0.77 N/A N/A Instrument: Blade N/A N/A Bleeding: Minimum N/A N/A Hemostasis Achieved: Pressure N/A N/A Debridement Treatment Procedure was tolerated well N/A  N/A Response: Post Debridement 1.4x0.7x0.1 N/A N/A Measurements L x W x D (cm) Post Debridement Volume: 0.077 N/A N/A (cm) Periwound Skin Texture: Excoriation: No N/A N/A Induration: No Callus: No Crepitus: No Rash: No Scarring: No Periwound Skin Moisture: Maceration: No N/A N/A Dry/Scaly: No Periwound Skin Color: Ecchymosis: Yes N/A N/A Atrophie Blanche: No Cyanosis: No Erythema: No Hemosiderin Staining: No Mottled: No Pallor: No Rubor: No Temperature: No Abnormality N/A N/A Tenderness on Palpation: Yes N/A N/A Wound Preparation: Ulcer Cleansing: N/A N/A Rinsed/Irrigated with Saline, Other: soap and water Topical Anesthetic Applied: Other: lidocaine 4% Procedures Performed: Debridement N/A N/A Treatment Notes Wound #1 (Left, Lateral Lower Leg) 1. Cleansed with: Clean wound with Normal Saline Cleanse wound with antibacterial soap and water 2. Anesthetic Topical Lidocaine 4% cream to wound bed prior to debridement 3. Peri-wound Care: Moisturizing lotion 4. Dressing Applied: Prisma Ag 5. Secondary Dressing Applied ABD Pad 7. Secured with Tape 3 Layer Compression System - Left Lower Extremity Notes [place], [name] ([id number]) Roland Rack to anchor Electronic Signature(s) Signed: 03/02/2018 2:44:52 PM By: Bonnell Public Entered By: Bonnell Public on 03/02/2018 14:44:51 Lamagna, [name] ([id number]) -------------------------------------------------------------------------------- Multi-Disciplinary Care Plan Details Patient Name: [name] Date of Service: 03/02/2018 1:30 PM Medical Record Number: [id number] Patient Account Number: [id number] Date of Birth/Sex: 1970/12/05 (47 y.o. F) Treating RN: Huel Coventry Primary Care Tracia Lacomb: Sandrea Hughs Other Clinician: Referring Keshav Winegar: Sandrea Hughs Treating Chelli Yerkes/Extender: Kathreen Cosier in Treatment: 4 Active Inactive ` Orientation to the Wound Care Program Nursing Diagnoses: Knowledge deficit related to the  wound healing center program Goals: Patient/caregiver will verbalize understanding of the Wound Healing Center Program Date Initiated: 02/02/2018 Target Resolution Date: 03/02/2018 Goal Status: Active Interventions: Provide education on orientation to the wound center Notes: ` Wound/Skin Impairment Nursing Diagnoses: Impaired tissue integrity Goals: Patient/caregiver will verbalize understanding of skin care regimen Date Initiated: 02/02/2018 Target Resolution Date: 03/02/2018 Goal Status: Active Ulcer/skin breakdown will have a volume reduction of 30% by week 4 Date Initiated: 02/02/2018 Target Resolution Date: 03/02/2018 Goal Status: Active Interventions: Assess patient/caregiver ability to obtain necessary supplies Assess ulceration(s) every visit Treatment Activities: Skin care regimen initiated : 02/02/2018 Notes: Electronic Signature(s) Signed: 03/02/2018 5:11:54 PM By: Elliot Gurney, BSN, RN, CWS, Kim RN, BSN Entered By: Elliot Gurney, BSN, RN, CWS, Kim on 03/02/2018 14:10:43 Inscore, [name] ([id number]) Chandler, Ernestene ([id number]) -------------------------------------------------------------------------------- Pain Assessment Details Patient Name: [name] Date of Service: 03/02/2018 1:30 PM Medical Record Number: [id number] Patient Account Number: [id number] Date of Birth/Sex: 07-31-1970 (47 y.o. F) Treating RN: Curtis Sites Primary Care Sai Moura: Sandrea Hughs Other Clinician: Referring Laurie Lovejoy: Sandrea Hughs Treating Laterica Matarazzo/Extender: Kathreen Cosier in Treatment: 4 Active Problems Location of Pain Severity and Description of Pain Patient Has Paino Yes Site Locations Pain Management and Medication Current Pain Management: Electronic Signature(s) Signed:  03/02/2018 3:43:39 PM By: Curtis Sites Entered By: Curtis Sites on 03/02/2018 13:53:43 Sferrazza, Gretel ([id number]) -------------------------------------------------------------------------------- Patient/Caregiver Education  Details Patient Name: [name] Date of Service: 03/02/2018 1:30 PM Medical Record Number: [id number] Patient Account Number: [id number] Date of Birth/Gender: [birth year] (47 y.o. F) Treating RN: Phillis Haggis Primary Care Physician: Sandrea Hughs Other Clinician: Referring Physician: Sandrea Hughs Treating Physician/Extender: Kathreen Cosier in Treatment: 4 Education Assessment Education Provided To: Patient Education Topics Provided Wound/Skin Impairment: Handouts: Caring for Your Ulcer, Skin Care Do's and Dont's, Other: change dressing as ordered Methods: Demonstration, Explain/Verbal Responses: State content correctly Electronic Signature(s) Signed: 03/03/2018 4:46:33 PM By: Alejandro Mulling Entered By: Alejandro Mulling on 03/02/2018 14:28:07 Hanneman, Labrisha ([id number]) -------------------------------------------------------------------------------- Wound Assessment Details Patient Name: [name] Date of Service: 03/02/2018 1:30 PM Medical Record Number: [id number] Patient Account Number: [id number] Date of Birth/Sex: Jun 07, 1971 (47 y.o. F) Treating RN: Curtis Sites Primary Care Rajvi Armentor: Sandrea Hughs Other Clinician: Referring Hinton Luellen: Sandrea Hughs Treating Rube Sanchez/Extender: Kathreen Cosier in Treatment: 4 Wound Status Wound Number: 1 Primary Etiology: Venous Leg Ulcer Wound Location: Left Lower Leg - Lateral Wound Status: Open Wounding Event: Gradually Appeared Comorbid History: Type II Diabetes Date Acquired: 01/19/2018 Weeks Of Treatment: 4 Clustered Wound: No Photos Photo Uploaded By: Curtis Sites on 03/02/2018 14:59:55 Wound Measurements Length: (cm) 1.4 Width: (cm) 0.7 Depth: (cm) 0.1 Area: (cm) 0.77 Volume: (cm) 0.077 % Reduction in Area: 71.6% % Reduction in Volume: 71.6% Epithelialization: Small (1-33%) Tunneling: No Undermining: No Wound Description Full Thickness Without Exposed Support Classification: Structures Wound  Margin: Flat and Intact Exudate Medium Amount: Exudate Type: Serosanguineous Exudate Color: red, brown Foul Odor After Cleansing: No Slough/Fibrino Yes Wound Bed Granulation Amount: Medium (34-66%) Exposed Structure Granulation Quality: Red, Pink Fascia Exposed: No Necrotic Amount: Medium (34-66%) Fat Layer (Subcutaneous Tissue) Exposed: Yes Necrotic Quality: Adherent Slough Tendon Exposed: No Muscle Exposed: No Joint Exposed: No Bone Exposed: No Hayward, Novalyn ([id number]) Periwound Skin Texture Texture Color No Abnormalities Noted: No No Abnormalities Noted: No Callus: No Atrophie Blanche: No Crepitus: No Cyanosis: No Excoriation: No Ecchymosis: Yes Induration: No Erythema: No Rash: No Hemosiderin Staining: No Scarring: No Mottled: No Pallor: No Moisture Rubor: No No Abnormalities Noted: No Dry / Scaly: No Temperature / Pain Maceration: No Temperature: No Abnormality Tenderness on Palpation: Yes Wound Preparation Ulcer Cleansing: Rinsed/Irrigated with Saline, Other: soap and water, Topical Anesthetic Applied: Other: lidocaine 4%, Treatment Notes Wound #1 (Left, Lateral Lower Leg) 1. Cleansed with: Clean wound with Normal Saline Cleanse wound with antibacterial soap and water 2. Anesthetic Topical Lidocaine 4% cream to wound bed prior to debridement 3. Peri-wound Care: Moisturizing lotion 4. Dressing Applied: Prisma Ag 5. Secondary Dressing Applied ABD Pad 7. Secured with Tape 3 Layer Compression System - Left Lower Extremity Notes unna to anchor Electronic Signature(s) Signed: 03/02/2018 3:43:39 PM By: Curtis Sites Entered By: Curtis Sites on 03/02/2018 14:01:54 [place], Clarie ([id number]) -------------------------------------------------------------------------------- Vitals Details Patient Name: [name] Date of Service: 03/02/2018 1:30 PM Medical Record Number: [id number] Patient Account Number: [id number] Date of Birth/Sex: August 17, 1970 (47  y.o. F) Treating RN: Curtis Sites Primary Care Ebonie Westerlund: Sandrea Hughs Other Clinician: Referring Deryl Ports: Sandrea Hughs Treating Rivkah Wolz/Extender: Kathreen Cosier in Treatment: 4 Vital Signs Time Taken: 13:55 Temperature (F): 98.1 Height (in): 65 Pulse (bpm): 93 Weight (lbs): 166.8 Respiratory Rate (breaths/min): 16 Body [name] Index (BMI): 27.8 Blood Pressure (mmHg): 104/62 Reference Range: 80 - 120 mg / dl Electronic Signature(s) Signed: 03/02/2018 3:43:39 PM By: Curtis Sites Entered By: Francesco Sor,  Joanna on 03/02/2018 13:55:55

## 2018-03-17 ENCOUNTER — Encounter: Payer: 59 | Admitting: Nurse Practitioner

## 2018-03-17 DIAGNOSIS — E1142 Type 2 diabetes mellitus with diabetic polyneuropathy: Secondary | ICD-10-CM | POA: Diagnosis not present

## 2018-03-17 DIAGNOSIS — Z09 Encounter for follow-up examination after completed treatment for conditions other than malignant neoplasm: Secondary | ICD-10-CM | POA: Diagnosis not present

## 2018-03-17 DIAGNOSIS — S81802A Unspecified open wound, left lower leg, initial encounter: Secondary | ICD-10-CM | POA: Diagnosis not present

## 2018-03-17 DIAGNOSIS — Z794 Long term (current) use of insulin: Secondary | ICD-10-CM | POA: Diagnosis not present

## 2018-03-17 DIAGNOSIS — Z872 Personal history of diseases of the skin and subcutaneous tissue: Secondary | ICD-10-CM | POA: Diagnosis not present

## 2018-03-17 NOTE — Progress Notes (Signed)
[place], [name] ([id number]) Visit Report for 02/16/2018 HPI Details Patient Name: [name], [name] Date of Service: 02/16/2018 1:30 PM Medical Record Number: [id number] Patient Account Number: [id number] Date of Birth/Sex: [birth year] (47 y.o. F) Treating [name]: [name] Primary Care Provider: Sandrea [name] Other Clinician: Referring Provider: Sandrea [name] Treating Provider/Extender: [name] in Treatment: 2 History of Present Illness HPI Description: ADMISSION 02/02/18 This is a 47 year old woman who is a poorly controlled type 2 diabetes with a recent hemoglobin A1c at the end of June/19 of 13.9. She's had medication adjustments made by her primary physician. I think her compliance with insulin has been poor. She tells me that 2 [name] ago she noticed a burning or stinging sensation on the left lateral leg. She thought something had better and then the area began itching. Her doctor prescribes some form of topical cream. She was also started on cephalexin and perhaps miconazole by her primary physician on July 9. She does not describe claudication. She is not a smoker. She does not have a wound history ABIs in our clinic were 0.9 on the right and 1.09 on the left 02/09/18; wound is about the same in terms of dimensions. Surface looks slightly better this week using Iodoflex under compression. She has poorly controlled diabetes but is working on this. I don't believe she has an arterial issue 02/16/18; it is just above the left renal malleolus. This is improved. I don't think we need to flex at this point change to collagen Electronic [name](s) Signed: 02/23/2018 8:05:08 AM By: [name] Najjar MD Entered By: [name] on 02/22/2018 20:42:40 [place], [name] ([id number]) -------------------------------------------------------------------------------- Physical Exam Details Patient Name: [name] Date of Service: 02/16/2018 1:30 PM Medical Record Number: [id number] Patient  Account Number: [id number] Date of Birth/Sex: April 22, 1971 (47 y.o. F) Treating [name]: [name] Primary Care Provider: Sandrea [name] Other Clinician: Referring Provider: Sandrea [name] Treating Provider/Extender: [name] in Treatment: 2 Constitutional Sitting or standing Blood Pressure is within target range for patient.. Pulse regular and within target range for patient.Marland Kitchen Respirations regular, non-labored and within target range.. Temperature is normal and within the target range for the patient.Marland Kitchen appears in no distress. Respiratory Respiratory effort is easy and symmetric bilaterally. Rate is normal at rest and on room air.. Cardiovascular Pedal pulses palpable and strong bilaterally.. no edema. Lymphatic none palpable in the popliteal area bilaterally. Integumentary (Hair, Skin) no primary skin issues are seen. Psychiatric No evidence of depression, anxiety, or agitation. Calm, cooperative, and communicative. Appropriate interactions and affect.. Notes wound exam; the area of the left lateral calf just above the medial malleolus. This is better healthier looking surface. No debridement is required. Electronic [name](s) Signed: 02/23/2018 8:05:08 AM By: [name] Najjar MD Entered By: [name] on 02/22/2018 20:44:22 [name], [name] ([id number]) -------------------------------------------------------------------------------- Physician Orders Details Patient Name: [name] Date of Service: 02/16/2018 1:30 PM Medical Record Number: [id number] Patient Account Number: [id number] Date of Birth/Sex: 09-06-1970 (47 y.o. F) Treating [name]: [name] Primary Care Provider: Sandrea [name] Other Clinician: Referring Provider: Sandrea [name] Treating Provider/Extender: [name] in Treatment: 2 Verbal / Phone Orders: No Diagnosis Coding Wound Cleansing Wound #1 Left,Lateral Lower Leg o Clean wound with Normal Saline. o May shower with  protection. Anesthetic (add to Medication List) Wound #1 Left,Lateral Lower Leg o Topical Lidocaine 4% cream applied to wound bed prior to debridement (In Clinic Only). Primary Wound Dressing Wound #1 Left,Lateral Lower Leg o Silver Collagen Secondary Dressing Wound #1 Left,Lateral Lower Leg o  ABD pad Dressing Change Frequency Wound #1 Left,Lateral Lower Leg o Change dressing every week Follow-up Appointments Wound #1 Left,Lateral Lower Leg o Return Appointment in 1 week. o Nurse Visit as needed Edema Control Wound #1 Left,Lateral Lower Leg o 3 Layer Compression System - Left Lower Extremity Electronic [name](s) Signed: 02/25/2018 6:17:48 PM By: [name], BSN, [name], CWS, [name], BSN Signed: 03/07/2018 8:57:37 AM By: [name] Najjarobson, [name] Kushner MD Previous [name]: 02/22/2018 4:35:56 PM Version By: [name] Sitesorthy, [name]: 02/23/2018 8:05:08 AM Version By: [name] Najjarobson, [name] Mcneff MD Entered By: [name], BSN, [name], CWS, [name] on 02/23/2018 15:52:45 SuttonGARCIA, [name] ([id number]) -------------------------------------------------------------------------------- Problem List Details Patient Name: [name], [name] Date of Service: 02/16/2018 1:30 PM Medical Record Number: [id number] Patient Account Number: [id number] Date of Birth/Sex: 03-15-1971 (47 y.o. 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 No change in odebridement Decision wound care dimension change Iodoflex to Making : Silver collagen I87.311 Chronic venous hypertension (idiopathic) with ulcer of right 02/02/2018 Yes  Yes lower extremity Status Complications Interventions Improving edema control is reasonable she is going to need Medical some compression Decision probably 3K Making : compression today E11.622 Type 2 diabetes mellitus with other skin ulcer 02/02/2018 No Yes E11.42 Type 2 diabetes mellitus with diabetic polyneuropathy 02/02/2018 No Yes Inactive Problems Resolved Problems Electronic [name](s) Signed: 02/23/2018 8:05:08 AM By: [name] Najjarobson, Lavern Maslow MD Entered By: [name] Najjarobson, Tyus Kallam on 02/22/2018 20:41:39 [name], [name] ([id number]) -------------------------------------------------------------------------------- Progress Note Details Patient Name: [name], [name] Date of Service: 02/16/2018 1:30 PM Medical Record Number: [id number] Patient Account Number: [id number] Date of Birth/Sex: 03-15-1971 (47 y.o. F) Treating [name]: [name] CoventryWoody, [name] Primary Care Provider: Sandrea HughsUBIO, [name] Other Clinician: Referring Provider: Sandrea HughsUBIO, [name] Treating Provider/Extender: [name] CarolinaOBSON, Alysen Smylie [name] [name] in Treatment: 2 Subjective History of Present Illness (HPI) ADMISSION 02/02/18 This is a 47 year old woman who is a poorly controlled type 2 diabetes with a recent hemoglobin A1c at the end of June/19 of 13.9. She's had medication adjustments made by her primary physician. I think her compliance with insulin has been poor. She tells me that 2 [name] ago she noticed a burning or stinging sensation on the left lateral leg. She thought something had better and then the area began itching. Her doctor prescribes some form of topical cream. She was also started on cephalexin and perhaps miconazole by her primary physician on July 9. She does not describe claudication. She is not a smoker. She does not have a wound history ABIs in our clinic were 0.9 on the right and 1.09 on the left 02/09/18; wound is about the same in terms of dimensions. Surface looks slightly better this week using Iodoflex under compression. She has poorly controlled  diabetes but is working on this. I don't believe she has an arterial issue 02/16/18; it is just above the left renal malleolus. This is improved. I don't think we need to flex at this point change to collagen Objective Constitutional Sitting or standing Blood Pressure is within target range for patient.. Pulse regular and within target range for patient.Marland Kitchen. Respirations regular, non-labored and within target range.. Temperature is normal and within the target range for the patient.Marland Kitchen. appears in no distress. Vitals Time Taken: 1:31 PM, Height: 65 in, Weight: 166.8  lbs, BMI: 27.8, Temperature: 98.4 F, Pulse: 86 bpm, Respiratory Rate: 16 breaths/min, Blood Pressure: 113/68 mmHg. Respiratory Respiratory effort is easy and symmetric bilaterally. Rate is normal at rest and on room air.. Cardiovascular Pedal pulses palpable and strong bilaterally.. no edema. Lymphatic none palpable in the popliteal area bilaterally. Psychiatric No evidence of depression, anxiety, or agitation. Calm, cooperative, and communicative. Appropriate interactions and affect.Marland Kitchen [place], [name] ([id number]) General Notes: wound exam; the area of the left lateral calf just above the medial malleolus. This is better healthier looking surface. No debridement is required. Integumentary (Hair, Skin) no primary skin issues are seen. Wound #1 status is Open. Original cause of wound was Gradually Appeared. The wound is located on the Left,Lateral Lower Leg. The wound measures 2.2cm length x 1.2cm width x 0.1cm depth; 2.073cm^2 area and 0.207cm^3 volume. There is Fat Layer (Subcutaneous Tissue) Exposed exposed. There is no tunneling or undermining noted. There is a medium amount of serosanguineous drainage noted. The wound margin is flat and intact. There is small (1-33%) pink granulation within the wound bed. There is a large (67-100%) amount of necrotic tissue within the wound bed including Adherent Slough. The periwound skin appearance  exhibited: Ecchymosis. The periwound skin appearance did not exhibit: Callus, Crepitus, Excoriation, Induration, Rash, Scarring, Dry/Scaly, Maceration, Atrophie Blanche, Cyanosis, Hemosiderin Staining, Mottled, Pallor, Rubor, Erythema. Periwound temperature was noted as No Abnormality. The periwound has tenderness on palpation. 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 There was a Three Layer Compression Therapy Procedure with a pre-treatment ABI of 1.1 by [name] Sites, [name]. Post procedure Diagnosis Wound #1: Same as Pre-Procedure Plan Wound Cleansing: Wound #1 Left,Lateral Lower Leg: Clean wound with Normal Saline. May shower with protection. Anesthetic (add to Medication List): Wound #1 Left,Lateral Lower Leg: Topical Lidocaine 4% cream applied to wound bed prior to debridement (In Clinic Only). Primary Wound Dressing: Wound #1 Left,Lateral Lower Leg: Iodoflex [name], [name] ([id number]) Secondary Dressing: Wound #1 Left,Lateral Lower Leg: ABD pad Dressing Change Frequency: Wound #1 Left,Lateral Lower Leg: Change dressing every week Follow-up Appointments: Wound #1 Left,Lateral Lower Leg: Return Appointment in 1 week. Nurse Visit as needed Edema Control: Wound #1 Left,Lateral Lower Leg: 3 Layer Compression System - Left Lower Extremity Medical Decision Making Non-pressure chronic ulcer of left calf limited to breakdown of skin 02/02/2018 Status: Improving Complications: surface of the wound looks slightly better. No change in wound care dimension Interventions: debridement change Iodoflex to Silver collagen Chronic venous  hypertension (idiopathic) with ulcer of right lower extremity 02/02/2018 Status: Improving Complications: edema control is reasonable Interventions: she is going to need some compression probably 3K compression today #1 change Iodoflex to collagen #2 continued 3 layer compression Electronic [name](s) Signed: 02/23/2018 8:05:08 AM By: [name] Najjar MD Entered By: [name] on 02/22/2018 20:49:37 [name], [name] ([id number]) -------------------------------------------------------------------------------- SuperBill Details Patient Name: [name] Date of Service: 02/16/2018 Medical Record Number: [id number] Patient Account Number: [id number] Date of Birth/Sex: [birth year] (48 y.o. F) Treating [name]: [name] Primary Care Provider: Sandrea [name] Other Clinician: Referring Provider: Sandrea [name] Treating Provider/Extender: [name] [place] in Treatment: 2 Diagnosis Coding ICD-10 Codes Code Description L97.221 Non-pressure chronic ulcer of left calf limited to breakdown of  skin I87.311 Chronic venous hypertension (idiopathic) with ulcer of right lower extremity E11.622 Type 2 diabetes mellitus with other skin ulcer E11.42 Type 2 diabetes mellitus with diabetic polyneuropathy Facility Procedures CPT4 Code: 16109604 Description: (Facility Use Only) [phone] - APPLY MULTLAY COMPRS LWR LT LEG Modifier: Quantity: 1 Physician Procedures CPT4 Code: [id number] Description: 99213 - WC PHYS LEVEL 3 - EST PT ICD-10 Diagnosis Description L97.221 Non-pressure chronic ulcer of left calf limited to breakdown o I87.311 Chronic venous hypertension (idiopathic) with ulcer of right l Modifier: f skin ower extremity Quantity: 1 Electronic [name](s) Signed: 02/23/2018 8:05:08 AM By: [name] Najjar MD Previous [name]: 02/22/2018 5:12:49 PM Version By: [name] Entered By: [name] on 02/22/2018 20:50:11

## 2018-03-24 NOTE — Progress Notes (Signed)
[place], [name] ([id number]) Visit Report for 03/09/2018 Chief Complaint Document Details Patient Name: [name], [name] Date of Service: 03/09/2018 3:15 PM Medical Record Number: [id number] Patient Account Number: [id number] Date of Birth/Sex: [birth year] (47 y.o. F) Treating RN: Huel Coventry Primary Care Provider: Sandrea Hughs Other Clinician: Referring Provider: Sandrea Hughs Treating Provider/Extender: Kathreen Cosier in Treatment: 5 Information Obtained from: Patient Chief Complaint patient is here for review of wound on her left lateral calf distally Electronic Signature(s) Signed: 03/09/2018 4:56:22 PM By: Bonnell Public Entered By: Bonnell Public on 03/09/2018 16:56:22 [name], [name] ([id number]) -------------------------------------------------------------------------------- Debridement Details Patient Name: [name] Date of Service: 03/09/2018 3:15 PM Medical Record Number: [id number] Patient Account Number: [id number] Date of Birth/Sex: 1970-09-06 (47 y.o. F) Treating RN: Huel Coventry Primary Care Provider: Sandrea Hughs Other Clinician: Referring Provider: Sandrea Hughs Treating Provider/Extender: Kathreen Cosier in Treatment: 5 Debridement Performed for Wound #1 Left,Lateral Lower Leg Assessment: Performed By: Physician Bonnell Public, NP Debridement Type: Debridement Severity of Tissue Pre Fat layer exposed Debridement: Pre-procedure Verification/Time Yes - 15:55 Out Taken: Start Time: 15:55 Pain Control: Other : lidocaine4% Total Area Debrided (L x W): 2 (cm) x 0.5 (cm) = 1 (cm) Tissue and other material Viable, Non-Viable, Slough, Subcutaneous, Biofilm, Slough debrided: Level: Skin/Subcutaneous Tissue Debridement Description: Excisional Instrument: Blade Bleeding: Minimum Hemostasis Achieved: Pressure End Time: 15:59 Procedural Pain: 0 Post Procedural Pain: 0 Response to Treatment: Procedure was tolerated well Level of Consciousness: Awake and  Alert Post Debridement Measurements of Total Wound Length: (cm) 2 Width: (cm) 0.5 Depth: (cm) 0.1 Volume: (cm) 0.079 Character of Wound/Ulcer Post Debridement: Stable Severity of Tissue Post Debridement: Fat layer exposed Post Procedure Diagnosis Same as Pre-procedure Electronic Signature(s) Signed: 03/09/2018 4:56:15 PM By: Bonnell Public Signed: 03/09/2018 5:18:50 PM By: Elliot Gurney, BSN, RN, CWS, Kim RN, BSN Entered By: Bonnell Public on 03/09/2018 16:56:15 [name], [name] ([id number]) -------------------------------------------------------------------------------- HPI Details Patient Name: [name] Date of Service: 03/09/2018 3:15 PM Medical Record Number: [id number] Patient Account Number: [id number] Date of Birth/Sex: 03/05/1971 (47 y.o. F) Treating RN: Huel Coventry Primary Care Provider: Sandrea Hughs Other Clinician: Referring Provider: Sandrea Hughs Treating Provider/Extender: Kathreen Cosier in Treatment: 5 History of Present Illness HPI Description: ADMISSION 02/02/18 This is a 47 year old woman who is a poorly controlled type 2 diabetes with a recent hemoglobin A1c at the end of June/19 of 13.9. She's had medication adjustments made by her primary physician. I think her compliance with insulin has been poor. She tells me that 2 weeks ago she noticed a burning or stinging sensation on the left lateral leg. She thought something had better and then the area began itching. Her doctor prescribes some form of topical cream. She was also started on cephalexin and perhaps miconazole by her primary physician on July 9. She does not describe claudication. She is not a smoker. She does not have a wound history ABIs in our clinic were 0.9 on the right and 1.09 on the left 02/09/18; wound is about the same in terms of dimensions. Surface looks slightly better this week using Iodoflex under compression. She has poorly controlled diabetes but is working on this. I don't believe she has an  arterial issue 02/16/18; it is just above the left mediall malleolus. This is improved. I don't think we need to flex at this point change to collagen 02/23/18;the patient's wound has improved in terms of dimensions. Healthy granulation. Using collagen/Prisma 03/02/18-She is seen in follow-up evaluation for a left lateral lower extremity wound. There is improvement.  We will continue with collagen and compression wrap and she'll be seen next week 03/09/18-She is seen in follow-up evaluation of the left lower extremity wound. She did remove her compression wrap over the weekend to attend a baby shower. The wound continues to improve. We will continue with same treatment plan and she will follow-up next week; she is close to being healed Electronic Signature(s) Signed: 03/09/2018 4:56:59 PM By: Bonnell Public Entered By: Bonnell Public on 03/09/2018 16:56:59 [name], [name] ([id number]) -------------------------------------------------------------------------------- Physician Orders Details Patient Name: [name] Date of Service: 03/09/2018 3:15 PM Medical Record Number: [id number] Patient Account Number: [id number] Date of Birth/Sex: [birth year] (47 y.o. F) Treating RN: Phillis Haggis Primary Care Provider: Sandrea Hughs Other Clinician: Referring Provider: Sandrea Hughs Treating Provider/Extender: Kathreen Cosier in Treatment: 5 Verbal / Phone Orders: No Diagnosis Coding Wound Cleansing Wound #1 Left,Lateral Lower Leg o Clean wound with Normal Saline. o May shower with protection. Anesthetic (add to Medication List) Wound #1 Left,Lateral Lower Leg o Topical Lidocaine 4% cream applied to wound bed prior to debridement (In Clinic Only). Skin Barriers/Peri-Wound Care Wound #1 Left,Lateral Lower Leg o Moisturizing lotion Primary Wound Dressing Wound #1 Left,Lateral Lower Leg o Silver Collagen Secondary Dressing Wound #1 Left,Lateral Lower Leg o ABD pad Dressing Change  Frequency Wound #1 Left,Lateral Lower Leg o Change dressing every week Follow-up Appointments Wound #1 Left,Lateral Lower Leg o Return Appointment in 1 week. o Nurse Visit as needed Edema Control Wound #1 Left,Lateral Lower Leg o 3 Layer Compression System - Left Lower Extremity Electronic Signature(s) Signed: 03/09/2018 5:10:54 PM By: Bonnell Public Signed: 03/14/2018 5:34:19 PM By: Alejandro Mulling Entered By: Alejandro Mulling on 03/09/2018 16:00:15 [name], [name] ([id number]) [place] [name], [name] ([id number]) -------------------------------------------------------------------------------- Problem List Details Patient Name: [name] Date of Service: 03/09/2018 3:15 PM Medical Record Number: [id number] Patient Account Number: [id number] Date of Birth/Sex: 19-Jan-1971 (47 y.o. F) Treating RN: Huel Coventry Primary Care Provider: Sandrea Hughs Other Clinician: Referring Provider: Sandrea Hughs Treating Provider/Extender: Kathreen Cosier in Treatment: 5 Active Problems ICD-10 Evaluated Encounter Code Description Active Date Today Diagnosis L97.222 Non-pressure chronic ulcer of left calf with fat layer exposed 02/02/2018 No Yes I87.311 Chronic venous hypertension (idiopathic) with ulcer of right 02/02/2018 No Yes lower extremity E11.622 Type 2 diabetes mellitus with other skin ulcer 02/02/2018 No Yes E11.42 Type 2 diabetes mellitus with diabetic polyneuropathy 02/02/2018 No Yes Inactive Problems Resolved Problems Electronic Signature(s) Signed: 03/09/2018 4:55:59 PM By: Bonnell Public Entered By: Bonnell Public on 03/09/2018 16:55:59 [name], [name] ([id number]) -------------------------------------------------------------------------------- Progress Note Details Patient Name: [name] Date of Service: 03/09/2018 3:15 PM Medical Record Number: [id number] Patient Account Number: [id number] Date of Birth/Sex: 1970-08-17 (47 y.o. F) Treating RN: Huel Coventry Primary Care Provider:  Sandrea Hughs Other Clinician: Referring Provider: Sandrea Hughs Treating Provider/Extender: Kathreen Cosier in Treatment: 5 Subjective Chief Complaint Information obtained from Patient patient is here for review of wound on her left lateral calf distally History of Present Illness (HPI) ADMISSION 02/02/18 This is a 47 year old woman who is a poorly controlled type 2 diabetes with a recent hemoglobin A1c at the end of June/19 of 13.9. She's had medication adjustments made by her primary physician. I think her compliance with insulin has been poor. She tells me that 2 weeks ago she noticed a burning or stinging sensation on the left lateral leg. She thought something had better and then the area began itching. Her doctor prescribes some form of topical cream. She was also started on cephalexin and perhaps  miconazole by her primary physician on July 9. She does not describe claudication. She is not a smoker. She does not have a wound history ABIs in our clinic were 0.9 on the right and 1.09 on the left 02/09/18; wound is about the same in terms of dimensions. Surface looks slightly better this week using Iodoflex under compression. She has poorly controlled diabetes but is working on this. I don't believe she has an arterial issue 02/16/18; it is just above the left mediall malleolus. This is improved. I don't think we need to flex at this point change to collagen 02/23/18;the patient's wound has improved in terms of dimensions. Healthy granulation. Using collagen/Prisma 03/02/18-She is seen in follow-up evaluation for a left lateral lower extremity wound. There is improvement. We will continue with collagen and compression wrap and she'll be seen next week 03/09/18-She is seen in follow-up evaluation of the left lower extremity wound. She did remove her compression wrap over the weekend to attend a baby shower. The wound continues to improve. We will continue with same treatment plan and she  will follow-up next week; she is close to being healed Objective Constitutional Vitals Time Taken: 3:40 PM, Height: 65 in, Weight: 166.8 lbs, BMI: 27.8, Temperature: 98.1 F, Pulse: 68 bpm, Respiratory Rate: 18 breaths/min, Blood Pressure: 123/77 mmHg. Integumentary (Hair, Skin) Wound #1 status is Open. Original cause of wound was Gradually Appeared. The wound is located on the Left,Lateral Lower Leg. The wound measures 2cm length x 0.5cm width x 0.1cm depth; 0.785cm^2 area and 0.079cm^3 volume. There is Fat Layer (Subcutaneous Tissue) Exposed exposed. There is no tunneling or undermining noted. There is a large amount of [name], [name] ([id number]) serosanguineous drainage noted. The wound margin is flat and intact. There is medium (34-66%) red, pink granulation within the wound bed. There is a medium (34-66%) amount of necrotic tissue within the wound bed including Adherent Slough. The periwound skin appearance exhibited: Ecchymosis. The periwound skin appearance did not exhibit: Callus, Crepitus, Excoriation, Induration, Rash, Scarring, Dry/Scaly, Maceration, Atrophie Blanche, Cyanosis, Hemosiderin Staining, Mottled, Pallor, Rubor, Erythema. Periwound temperature was noted as No Abnormality. The periwound has tenderness on palpation. Assessment Active Problems ICD-10 Non-pressure chronic ulcer of left calf with fat layer exposed Chronic venous hypertension (idiopathic) with ulcer of right lower extremity Type 2 diabetes mellitus with other skin ulcer Type 2 diabetes mellitus with diabetic polyneuropathy Procedures Wound #1 Pre-procedure diagnosis of Wound #1 is a Venous Leg Ulcer located on the Left,Lateral Lower Leg .Severity of Tissue Pre Debridement is: Fat layer exposed. There was a Excisional Skin/Subcutaneous Tissue Debridement with a total area of 1 sq cm performed by Bonnell Public, NP. With the following instrument(s): Blade to remove Viable and Non-Viable  tissue/material. Material removed includes Subcutaneous Tissue, Slough, and Biofilm after achieving pain control using Other (lidocaine4%). No specimens were taken. A time out was conducted at 15:55, prior to the start of the procedure. A Minimum amount of bleeding was controlled with Pressure. The procedure was tolerated well with a pain level of 0 throughout and a pain level of 0 following the procedure. Patient s Level of Consciousness post procedure was recorded as Awake and Alert. Post Debridement Measurements: 2cm length x 0.5cm width x 0.1cm depth; 0.079cm^3 volume. Character of Wound/Ulcer Post Debridement is stable. Severity of Tissue Post Debridement is: Fat layer exposed. Post procedure Diagnosis Wound #1: Same as Pre-Procedure Plan Wound Cleansing: Wound #1 Left,Lateral Lower Leg: Clean wound with Normal Saline. May shower with protection. Anesthetic (add  to Medication List): Wound #1 Left,Lateral Lower Leg: Topical Lidocaine 4% cream applied to wound bed prior to debridement (In Clinic Only). Skin Barriers/Peri-Wound Care: Wound #1 Left,Lateral Lower Leg: Moisturizing lotion Primary Wound Dressing: Wound #1 Left,Lateral Lower Leg: Silver Collagen [place], [place] ([id number]) Secondary Dressing: Wound #1 Left,Lateral Lower Leg: ABD pad Dressing Change Frequency: Wound #1 Left,Lateral Lower Leg: Change dressing every week Follow-up Appointments: Wound #1 Left,Lateral Lower Leg: Return Appointment in 1 week. Nurse Visit as needed Edema Control: Wound #1 Left,Lateral Lower Leg: 3 Layer Compression System - Left Lower Extremity Electronic Signature(s) Signed: 03/09/2018 4:57:08 PM By: Bonnell Publicoulter, [name] Entered By: Bonnell Publicoulter, [name] on 03/09/2018 16:57:08 [name], [name] ([id number]) -------------------------------------------------------------------------------- SuperBill Details Patient Name: [name] MassyGARCIA, [name] Date of Service: 03/09/2018 Medical Record Number: [id number] Patient Account  Number: [id number] Date of Birth/Sex: Jan 29, 1971 (47 y.o. F) Treating RN: Huel CoventryWoody, Kim Primary Care Provider: Sandrea HughsUBIO, JESSICA Other Clinician: Referring Provider: Sandrea HughsUBIO, JESSICA Treating Provider/Extender: Kathreen Cosieroulter, [name] Paterson Weeks in Treatment: 5 Diagnosis Coding ICD-10 Codes Code Description [phone] Non-pressure chronic ulcer of left calf with fat layer exposed I87.311 Chronic venous hypertension (idiopathic) with ulcer of right lower extremity E11.622 Type 2 diabetes mellitus with other skin ulcer E11.42 Type 2 diabetes mellitus with diabetic polyneuropathy Facility Procedures CPT4 Code: [id number] Description: 11042 - DEB SUBQ TISSUE 20 SQ CM/< ICD-10 Diagnosis Description L97.222 Non-pressure chronic ulcer of left calf with fat layer expo Modifier: sed Quantity: 1 Physician Procedures CPT4 Code: [id number] Description: 11042 - WC PHYS SUBQ TISS 20 SQ CM ICD-10 Diagnosis Description L97.222 Non-pressure chronic ulcer of left calf with fat layer expo Modifier: sed Quantity: 1 Electronic Signature(s) Signed: 03/09/2018 4:57:15 PM By: Bonnell Publicoulter, [name] Entered By: Bonnell Publicoulter, Brynna Dobos on 03/09/2018 16:57:15

## 2018-03-24 NOTE — Progress Notes (Signed)
[place], [name] ([id number]) Visit Report for 03/09/2018 Arrival Information Details Patient Name: [name], [name] Date of Service: 03/09/2018 3:15 PM Medical Record Number: [id number] Patient Account Number: [id number] Date of Birth/Sex: [birth year] (47 y.o. F) Treating RN: Phillis Haggis Primary Care Tannie Koskela: Sandrea Hughs Other Clinician: Referring Emelina Hinch: Sandrea Hughs Treating Sakiyah Shur/Extender: Kathreen Cosier in Treatment: 5 Visit Information History Since Last Visit All ordered tests and consults were completed: No Patient Arrived: Ambulatory Added or deleted any medications: No Arrival Time: 15:38 Any new allergies or adverse reactions: No Accompanied By: daughter, interpretorr Had a fall or experienced change in No activities of daily living that may affect Transfer Assistance: None risk of falls: Patient Identification Verified: Yes Signs or symptoms of abuse/neglect since last visito No Secondary Verification Process Yes Hospitalized since last visit: No Completed: Implantable device outside of the clinic excluding No Patient Requires Transmission-Based No cellular tissue based products placed in the center Precautions: since last visit: Patient Has Alerts: Yes Has Dressing in Place as Prescribed: Yes Patient Alerts: DM II Has Compression in Place as Prescribed: Yes Pain Present Now: Yes Electronic Signature(s) Signed: 03/14/2018 5:34:19 PM By: Alejandro Mulling Entered By: Alejandro Mulling on 03/09/2018 15:39:21 [name], [name] ([id number]) -------------------------------------------------------------------------------- Lower Extremity Assessment Details Patient Name: [name] [name] Date of Service: 03/09/2018 3:15 PM Medical Record Number: [id number] Patient Account Number: [id number] Date of Birth/Sex: [birth year] (47 y.o. F) Treating RN: Phillis Haggis Primary Care Josaphine Shimamoto: Sandrea Hughs Other Clinician: Referring Chanler Schreiter: Sandrea Hughs Treating  Rilan Eiland/Extender: Kathreen Cosier in Treatment: 5 Edema Assessment Assessed: [Left: No] [Right: No] [Left: Edema] [Right: :] Calf Left: Right: Point of Measurement: 27 cm From Medial Instep 36.4 cm cm Ankle Left: Right: Point of Measurement: 12 cm From Medial Instep 23.6 cm cm Vascular Assessment Pulses: Dorsalis Pedis Palpable: [Left:Yes] Posterior Tibial Extremity colors, hair growth, and conditions: Extremity Color: [Left:Normal] Temperature of Extremity: [Left:Warm] Capillary Refill: [Left:< 3 seconds] Toe Nail Assessment Left: Right: Thick: No Discolored: No Deformed: No Improper Length and Hygiene: No Electronic Signature(s) Signed: 03/14/2018 5:34:19 PM By: Alejandro Mulling Entered By: Alejandro Mulling on 03/09/2018 15:51:07 [name], [name] ([id number]) -------------------------------------------------------------------------------- Multi Wound Chart Details Patient Name: [name] [name] Date of Service: 03/09/2018 3:15 PM Medical Record Number: [id number] Patient Account Number: [id number] Date of Birth/Sex: [birth year] (47 y.o. F) Treating RN: Phillis Haggis Primary Care Trasean Delima: Sandrea Hughs Other Clinician: Referring Johngabriel Verde: Sandrea Hughs Treating Deklan Minar/Extender: Kathreen Cosier in Treatment: 5 Vital Signs Height(in): 65 Pulse(bpm): 68 Weight(lbs): 166.8 Blood Pressure(mmHg): 123/77 Body Mass Index(BMI): 28 Temperature(F): 98.1 Respiratory Rate 18 (breaths/min): Photos: [1:No Photos] [N/A:N/A] Wound Location: [1:Left Lower Leg - Lateral] [N/A:N/A] Wounding Event: [1:Gradually Appeared] [N/A:N/A] Primary Etiology: [1:Venous Leg Ulcer] [N/A:N/A] Comorbid History: [1:Type II Diabetes] [N/A:N/A] Date Acquired: [1:01/19/2018] [N/A:N/A] Weeks of Treatment: [1:5] [N/A:N/A] Wound Status: [1:Open] [N/A:N/A] Measurements L x W x D [1:2x0.5x0.1] [N/A:N/A] (cm) Area (cm) : [1:0.785] [N/A:N/A] Volume (cm) : [1:0.079] [N/A:N/A] % Reduction in  Area: [1:71.00%] [N/A:N/A] % Reduction in Volume: [1:70.80%] [N/A:N/A] Classification: [1:Full Thickness Without Exposed Support Structures] [N/A:N/A] Exudate Amount: [1:Large] [N/A:N/A] Exudate Type: [1:Serosanguineous] [N/A:N/A] Exudate Color: [1:red, brown] [N/A:N/A] Wound Margin: [1:Flat and Intact] [N/A:N/A] Granulation Amount: [1:Medium (34-66%)] [N/A:N/A] Granulation Quality: [1:Red, Pink] [N/A:N/A] Necrotic Amount: [1:Medium (34-66%)] [N/A:N/A] Exposed Structures: [1:Fat Layer (Subcutaneous Tissue) Exposed: Yes Fascia: No Tendon: No Muscle: No Joint: No Bone: No] [N/A:N/A] Epithelialization: [1:Small (1-33%)] [N/A:N/A] Periwound Skin Texture: [1:Excoriation: No Induration: No Callus: No Crepitus: No Rash: No Scarring: No] [N/A:N/A] Periwound Skin Moisture: Maceration: No N/A N/A Dry/Scaly:  No Periwound Skin Color: Ecchymosis: Yes N/A N/A Atrophie Blanche: No Cyanosis: No Erythema: No Hemosiderin Staining: No Mottled: No Pallor: No Rubor: No Temperature: No Abnormality N/A N/A Tenderness on Palpation: Yes N/A N/A Wound Preparation: Ulcer Cleansing: N/A N/A Rinsed/Irrigated with Saline, Other: soap and water Topical Anesthetic Applied: Other: lidocaine 4% Treatment Notes Electronic Signature(s) Signed: 03/14/2018 5:34:19 PM By: Alejandro Mulling Entered By: Alejandro Mulling on 03/09/2018 15:54:47 [name], [name] ([id number]) -------------------------------------------------------------------------------- Multi-Disciplinary Care Plan Details Patient Name: [name] [name] Date of Service: 03/09/2018 3:15 PM Medical Record Number: [id number] Patient Account Number: [id number] Date of Birth/Sex: [birth year] (47 y.o. F) Treating RN: Phillis Haggis Primary Care Alexi Dorminey: Sandrea Hughs Other Clinician: Referring Abron Neddo: Sandrea Hughs Treating Sherylann Vangorden/Extender: Kathreen Cosier in Treatment: 5 Active Inactive ` Orientation to the Wound Care Program Nursing  Diagnoses: Knowledge deficit related to the wound healing center program Goals: Patient/caregiver will verbalize understanding of the Wound Healing Center Program Date Initiated: 02/02/2018 Target Resolution Date: 03/02/2018 Goal Status: Active Interventions: Provide education on orientation to the wound center Notes: ` Wound/Skin Impairment Nursing Diagnoses: Impaired tissue integrity Goals: Patient/caregiver will verbalize understanding of skin care regimen Date Initiated: 02/02/2018 Target Resolution Date: 03/02/2018 Goal Status: Active Ulcer/skin breakdown will have a volume reduction of 30% by week 4 Date Initiated: 02/02/2018 Target Resolution Date: 03/02/2018 Goal Status: Active Interventions: Assess patient/caregiver ability to obtain necessary supplies Assess ulceration(s) every visit Treatment Activities: Skin care regimen initiated : 02/02/2018 Notes: Electronic Signature(s) Signed: 03/14/2018 5:34:19 PM By: Alejandro Mulling Entered By: Alejandro Mulling on 03/09/2018 15:54:08 [name], [name] ([id number]) [name], [name] ([id number]) -------------------------------------------------------------------------------- Pain Assessment Details Patient Name: [name] [name] Date of Service: 03/09/2018 3:15 PM Medical Record Number: [id number] Patient Account Number: [id number] Date of Birth/Sex: 1971-07-12 (47 y.o. F) Treating RN: Phillis Haggis Primary Care Jarred Purtee: Sandrea Hughs Other Clinician: Referring Jemery Stacey: Sandrea Hughs Treating Sawsan Riggio/Extender: Kathreen Cosier in Treatment: 5 Active Problems Location of Pain Severity and Description of Pain Patient Has Paino Yes Site Locations Pain Location: Pain in Ulcers Rate the pain. Current Pain Level: 3 Character of Pain Describe the Pain: Aching Pain Management and Medication Current Pain Management: Electronic Signature(s) Signed: 03/14/2018 5:34:19 PM By: Alejandro Mulling Entered By: Alejandro Mulling on 03/09/2018  15:39:34 [name], [name] ([id number]) -------------------------------------------------------------------------------- Wound Assessment Details Patient Name: [name] [name] Date of Service: 03/09/2018 3:15 PM Medical Record Number: [id number] Patient Account Number: [id number] Date of Birth/Sex: 1970-09-06 (47 y.o. F) Treating RN: Phillis Haggis Primary Care Rayvin Abid: Sandrea Hughs Other Clinician: Referring Markie Heffernan: Sandrea Hughs Treating Toyia Jelinek/Extender: Kathreen Cosier in Treatment: 5 Wound Status Wound Number: 1 Primary Etiology: Venous Leg Ulcer Wound Location: Left Lower Leg - Lateral Wound Status: Open Wounding Event: Gradually Appeared Comorbid History: Type II Diabetes Date Acquired: 01/19/2018 Weeks Of Treatment: 5 Clustered Wound: No Photos Photo Uploaded By: Alejandro Mulling on 03/09/2018 16:12:23 Wound Measurements Length: (cm) 2 Width: (cm) 0.5 Depth: (cm) 0.1 Area: (cm) 0.785 Volume: (cm) 0.079 % Reduction in Area: 71% % Reduction in Volume: 70.8% Epithelialization: Small (1-33%) Tunneling: No Undermining: No Wound Description Full Thickness Without Exposed Support Classification: Structures Wound Margin: Flat and Intact Exudate Large Amount: Exudate Type: Serosanguineous Exudate Color: red, brown Foul Odor After Cleansing: No Slough/Fibrino Yes Wound Bed Granulation Amount: Medium (34-66%) Exposed Structure Granulation Quality: Red, Pink Fascia Exposed: No Necrotic Amount: Medium (34-66%) Fat Layer (Subcutaneous Tissue) Exposed: Yes Necrotic Quality: Adherent Slough Tendon Exposed: No Muscle Exposed: No Joint Exposed: No Bone Exposed: No [name], [name] ([id number]) Periwound Skin Texture Texture Color  No Abnormalities Noted: No No Abnormalities Noted: No Callus: No Atrophie Blanche: No Crepitus: No Cyanosis: No Excoriation: No Ecchymosis: Yes Induration: No Erythema: No Rash: No Hemosiderin Staining: No Scarring: No Mottled:  No Pallor: No Moisture Rubor: No No Abnormalities Noted: No Dry / Scaly: No Temperature / Pain Maceration: No Temperature: No Abnormality Tenderness on Palpation: Yes Wound Preparation Ulcer Cleansing: Rinsed/Irrigated with Saline, Other: soap and water, Topical Anesthetic Applied: Other: lidocaine 4%, Electronic Signature(s) Signed: 03/14/2018 5:34:19 PM By: Alejandro MullingPinkerton, Debra Entered By: Alejandro MullingPinkerton, Debra on 03/09/2018 15:49:11 [name], [name] ([id number]) -------------------------------------------------------------------------------- Vitals Details Patient Name: [name] MassyGARCIA, [name] Date of Service: 03/09/2018 3:15 PM Medical Record Number: [id number] Patient Account Number: [id number] Date of Birth/Sex: 22-Nov-1970 (47 y.o. F) Treating RN: Phillis HaggisPinkerton, Debi Primary Care Kaynan Klonowski: Sandrea HughsUBIO, JESSICA Other Clinician: Referring Marzell Isakson: Sandrea HughsUBIO, JESSICA Treating Izreal Kock/Extender: Kathreen Cosieroulter, Leah Weeks in Treatment: 5 Vital Signs Time Taken: 15:40 Temperature (F): 98.1 Height (in): 65 Pulse (bpm): 68 Weight (lbs): 166.8 Respiratory Rate (breaths/min): 18 Body Mass Index (BMI): 27.8 Blood Pressure (mmHg): 123/77 Reference Range: 80 - 120 mg / dl Electronic Signature(s) Signed: 03/14/2018 5:34:19 PM By: Alejandro MullingPinkerton, Debra Entered By: Alejandro MullingPinkerton, Debra on 03/09/2018 15:42:45

## 2018-03-25 DIAGNOSIS — E1165 Type 2 diabetes mellitus with hyperglycemia: Secondary | ICD-10-CM | POA: Diagnosis not present

## 2018-03-25 DIAGNOSIS — Z1329 Encounter for screening for other suspected endocrine disorder: Secondary | ICD-10-CM | POA: Diagnosis not present

## 2018-03-25 DIAGNOSIS — Z1389 Encounter for screening for other disorder: Secondary | ICD-10-CM | POA: Diagnosis not present

## 2018-03-26 NOTE — Progress Notes (Signed)
[place], [name] ([id number]) Visit Report for 03/17/2018 Arrival Information Details Patient Name: [name], [name] Date of Service: 03/17/2018 2:30 PM Medical Record Number: [id number] Patient Account Number: [id number] Date of Birth/Sex: 11-25-1970 (47 y.o. F) Treating RN: [name] Primary Care [name]: [name] Other Clinician: Referring [name]: [name] Treating [name]/Extender: [name] in Treatment: 6 Visit Information History Since Last Visit Added or deleted any medications: No Patient Arrived: Ambulatory Any new allergies or adverse reactions: No Arrival Time: 14:50 Had a fall or experienced change in No Accompanied By: friend activities of daily living that may affect Transfer Assistance: None risk of falls: Patient Identification Verified: Yes Signs or symptoms of abuse/neglect since last visito No Secondary Verification Process Completed: Yes Hospitalized since last visit: No Patient Requires Transmission-Based No Implantable device outside of the clinic excluding No Precautions: cellular tissue based products placed in the center Patient Has Alerts: Yes since last visit: Patient Alerts: DM II Has Dressing in Place as Prescribed: Yes Has Compression in Place as Prescribed: Yes Pain Present Now: No Electronic Signature(s) Signed: 03/18/2018 4:18:40 PM By: [name] Entered By: [name] ([id number]) -------------------------------------------------------------------------------- Clinic Level of Care Assessment Details Patient Name: [name] Date of Service: 03/17/2018 2:30 PM Medical Record Number: [id number] Patient Account Number: [id number] Date of Birth/Sex: 28-Dec-1970 (47 y.o. F) Treating RN: Phillis Haggis Primary Care [name]: [name] Other Clinician: Referring [name]: [name] Treating [name]/Extender: [name] in Treatment: 6 Clinic Level of Care  Assessment Items TOOL 4 Quantity Score X - Use when only an EandM is performed on FOLLOW-UP visit 1 0 ASSESSMENTS - Nursing Assessment / Reassessment X - Reassessment of Co-morbidities (includes updates in patient status) 1 10 X- 1 5 Reassessment of Adherence to Treatment Plan ASSESSMENTS - Wound and Skin Assessment / Reassessment X - Simple Wound Assessment / Reassessment - one wound 1 5 []  - 0 Complex Wound Assessment / Reassessment - multiple wounds []  - 0 Dermatologic / Skin Assessment (not related to wound area) ASSESSMENTS - Focused Assessment []  - Circumferential Edema Measurements - multi extremities 0 []  - 0 Nutritional Assessment / Counseling / Intervention []  - 0 Lower Extremity Assessment (monofilament, tuning fork, pulses) []  - 0 Peripheral Arterial Disease Assessment (using hand held doppler) ASSESSMENTS - Ostomy and/or Continence Assessment and Care []  - Incontinence Assessment and Management 0 []  - 0 Ostomy Care Assessment and Management (repouching, etc.) PROCESS - Coordination of Care X - Simple Patient / Family Education for ongoing care 1 15 []  - 0 Complex (extensive) Patient / Family Education for ongoing care []  - 0 Staff obtains Chiropractor, Records, Test Results / Process Orders []  - 0 Staff telephones HHA, Nursing Homes / Clarify orders / etc []  - 0 Routine Transfer to another Facility (non-emergent condition) []  - 0 Routine Hospital Admission (non-emergent condition) []  - 0 New Admissions / Manufacturing engineer / Ordering NPWT, Apligraf, etc. []  - 0 Emergency Hospital Admission (emergent condition) X- 1 10 Simple Discharge Coordination [place], [name] ([id number]) []  - 0 Complex (extensive) Discharge Coordination PROCESS - Special Needs []  - Pediatric / Minor Patient Management 0 []  - 0 Isolation Patient Management []  - 0 Hearing / Language / Visual special needs []  - 0 Assessment of Community assistance (transportation, D/C planning,  etc.) []  - 0 Additional assistance / Altered mentation []  - 0 Support Surface(s) Assessment (bed, cushion, seat, etc.) INTERVENTIONS - Wound Cleansing / Measurement X - Simple Wound Cleansing - one wound 1 5 []  -  0 Complex Wound Cleansing - multiple wounds X- 1 5 Wound Imaging (photographs - any number of wounds) []  - 0 Wound Tracing (instead of photographs) []  - 0 Simple Wound Measurement - one wound []  - 0 Complex Wound Measurement - multiple wounds INTERVENTIONS - Wound Dressings []  - Small Wound Dressing one or multiple wounds 0 []  - 0 Medium Wound Dressing one or multiple wounds []  - 0 Large Wound Dressing one or multiple wounds []  - 0 Application of Medications - topical []  - 0 Application of Medications - injection INTERVENTIONS - Miscellaneous []  - External ear exam 0 []  - 0 Specimen Collection (cultures, biopsies, blood, body fluids, etc.) []  - 0 Specimen(s) / Culture(s) sent or taken to Lab for analysis []  - 0 Patient Transfer (multiple staff / Nurse, adultHoyer Lift / Similar devices) []  - 0 Simple Staple / Suture removal (25 or less) []  - 0 Complex Staple / Suture removal (26 or more) []  - 0 Hypo / Hyperglycemic Management (close monitor of Blood Glucose) []  - 0 Ankle / Brachial Index (ABI) - do not check if billed separately X- 1 5 Vital Signs [name], [name] ([id number]) Has the patient been seen at the hospital within the last three years: Yes Total Score: 60 Level Of Care: New/Established - Level 2 Electronic Signature(s) Signed: 03/18/2018 3:54:45 PM By: [name], Debra Entered By: [name], Debra on 03/17/2018 16:40:24 [name], [name] ([id number]) -------------------------------------------------------------------------------- Encounter Discharge Information Details Patient Name: [name] Date of Service: 03/17/2018 2:30 PM Medical Record Number: [id number] Patient Account Number: [id number] Date of Birth/Sex: 04-14-1971 (47 y.o. F) Treating RN: Phillis HaggisPinkerton,  Debi Primary Care Chicquita Mendel: [name] HughsUBIO, JESSICA Other Clinician: Referring Miroslava Santellan: [name] HughsUBIO, JESSICA Treating Nadya Hopwood/Extender: [name] Cosieroulter, Leah Weeks in Treatment: 6 Encounter Discharge Information Items Discharge Condition: Stable Ambulatory Status: Ambulatory Discharge Destination: Home Transportation: Private Auto Accompanied By: Nuala Alphadauighter, interpretor Schedule Follow-up Appointment: No Clinical Summary of Care: Electronic Signature(s) Signed: 03/18/2018 3:54:45 PM By: [name], Debra Entered By: [name], Debra on 03/17/2018 15:13:49 [name], [name] ([id number]) -------------------------------------------------------------------------------- Lower Extremity Assessment Details Patient Name: [name] Date of Service: 03/17/2018 2:30 PM Medical Record Number: [id number] Patient Account Number: [id number] Date of Birth/Sex: 04-14-1971 (47 y.o. F) Treating RN: [name] Sitesorthy, Joanna Primary Care Ivalee Strauser: [name] HughsUBIO, JESSICA Other Clinician: Referring Loman Logan: [name] HughsUBIO, JESSICA Treating Tyshell Ramberg/Extender: [name] Cosieroulter, Leah Weeks in Treatment: 6 Edema Assessment Assessed: [Left: No] [Right: No] [Left: Edema] [Right: :] Calf Left: Right: Point of Measurement: 27 cm From Medial Instep 36.8 cm cm Ankle Left: Right: Point of Measurement: 12 cm From Medial Instep 23.4 cm cm Vascular Assessment Pulses: Dorsalis Pedis Palpable: [Left:Yes] Posterior Tibial Extremity colors, hair growth, and conditions: Extremity Color: [Left:Normal] Hair Growth on Extremity: [Left:No] Temperature of Extremity: [Left:Warm] Capillary Refill: [Left:< 3 seconds] Toe Nail Assessment Left: Right: Thick: Yes Discolored: No Deformed: No Improper Length and Hygiene: No Electronic Signature(s) Signed: 03/18/2018 4:18:40 PM By: [name] Sitesorthy, Joanna Entered By: [name] Sitesorthy, Joanna on 03/17/2018 14:57:58 [name], [name] ([id number]) -------------------------------------------------------------------------------- Multi Wound Chart  Details Patient Name: [name] Date of Service: 03/17/2018 2:30 PM Medical Record Number: [id number] Patient Account Number: [id number] Date of Birth/Sex: 04-14-1971 (47 y.o. F) Treating RN: Phillis HaggisPinkerton, Debi Primary Care Stony Stegmann: [name] HughsUBIO, JESSICA Other Clinician: Referring Bryleigh Ottaway: [name] HughsUBIO, JESSICA Treating Ashkan Chamberland/Extender: [name] Cosieroulter, Leah Weeks in Treatment: 6 Vital Signs Height(in): 65 Pulse(bpm): 86 Weight(lbs): 166.8 Blood Pressure(mmHg): 104/68 Body Mass Index(BMI): 28 Temperature(F): 98.4 Respiratory Rate 16 (breaths/min): Photos: [1:No Photos] [N/A:N/A] Wound Location: [1:Left, Lateral Lower Leg] [N/A:N/A] Wounding Event: [1:Gradually Appeared] [N/A:N/A] Primary Etiology: [1:Venous Leg Ulcer] [N/A:N/A] Comorbid History: [1:Type  II Diabetes] [N/A:N/A] Date Acquired: [1:01/19/2018] [N/A:N/A] Weeks of Treatment: [1:6] [N/A:N/A] Wound Status: [1:Healed - Epithelialized] [N/A:N/A] Measurements L x W x D [1:0x0x0] [N/A:N/A] (cm) Area (cm) : [1:0] [N/A:N/A] Volume (cm) : [1:0] [N/A:N/A] % Reduction in Area: [1:100.00%] [N/A:N/A] % Reduction in Volume: [1:100.00%] [N/A:N/A] Classification: [1:Full Thickness Without Exposed Support Structures] [N/A:N/A] Exudate Amount: [1:Large] [N/A:N/A] Exudate Type: [1:Serosanguineous] [N/A:N/A] Exudate Color: [1:red, brown] [N/A:N/A] Wound Margin: [1:Flat and Intact] [N/A:N/A] Granulation Amount: [1:Medium (34-66%)] [N/A:N/A] Granulation Quality: [1:Red, Pink] [N/A:N/A] Necrotic Amount: [1:Medium (34-66%)] [N/A:N/A] Exposed Structures: [1:Fat Layer (Subcutaneous Tissue) Exposed: Yes Fascia: No Tendon: No Muscle: No Joint: No Bone: No] [N/A:N/A] Epithelialization: [1:Small (1-33%)] [N/A:N/A] Periwound Skin Texture: [1:Excoriation: No Induration: No Callus: No Crepitus: No Rash: No Scarring: No] [N/A:N/A] Periwound Skin Moisture: Maceration: No N/A N/A Dry/Scaly: No Periwound Skin Color: Ecchymosis: Yes N/A N/A Atrophie  Blanche: No Cyanosis: No Erythema: No Hemosiderin Staining: No Mottled: No Pallor: No Rubor: No Temperature: No Abnormality N/A N/A Tenderness on Palpation: Yes N/A N/A Wound Preparation: Ulcer Cleansing: N/A N/A Rinsed/Irrigated with Saline, Other: soap and water Topical Anesthetic Applied: Other: lidocaine 4% Treatment Notes Electronic Signature(s) Signed: 03/17/2018 3:15:16 PM By: Bonnell Public Entered By: Bonnell Public on 03/17/2018 15:15:16 [name], [name] ([id number]) -------------------------------------------------------------------------------- Multi-Disciplinary Care Plan Details Patient Name: [name] Date of Service: 03/17/2018 2:30 PM Medical Record Number: [id number] Patient Account Number: [id number] Date of Birth/Sex: 05/31/1971 (47 y.o. F) Treating RN: Phillis Haggis Primary Care Ninamarie Keel: [name] Other Clinician: Referring Therese Rocco: [name] Treating Isobella Ascher/Extender: [name] in Treatment: 6 Active Inactive Electronic Signature(s) Signed: 03/18/2018 3:54:45 PM By: [name] Mulling Entered By: [name] Mulling on 03/17/2018 15:12:31 [name], [name] ([id number]) -------------------------------------------------------------------------------- Pain Assessment Details Patient Name: [name] Date of Service: 03/17/2018 2:30 PM Medical Record Number: [id number] Patient Account Number: [id number] Date of Birth/Sex: 1971-06-20 (47 y.o. F) Treating RN: [name] Primary Care Jailee Jaquez: [name] Other Clinician: Referring Huy Majid: [name] Treating Pennelope Basque/Extender: [name] in Treatment: 6 Active Problems Location of Pain Severity and Description of Pain Patient Has Paino No Site Locations Pain Management and Medication Current Pain Management: Electronic Signature(s) Signed: 03/18/2018 4:18:40 PM By: [name] Entered By: [name] on 03/17/2018 14:51:38 [name], [name]  ([id number]) -------------------------------------------------------------------------------- Patient/Caregiver Education Details Patient Name: [name] Date of Service: 03/17/2018 2:30 PM Medical Record Number: [id number] Patient Account Number: [id number] Date of Birth/Gender: [birth year] (47 y.o. F) Treating RN: Phillis Haggis Primary Care Physician: [name] Other Clinician: Referring Physician: Sandrea [name] Treating Physician/Extender: [name] in Treatment: 6 Education Assessment Education Provided To: Patient Education Topics Provided Wound/Skin Impairment: Handouts: Other: Please contact our office if you have any questions or concerns. Methods: Explain/Verbal Responses: State content correctly Electronic Signature(s) Signed: 03/18/2018 3:54:45 PM By: [name] Mulling Entered By: [name] Mulling on 03/17/2018 15:14:00 [name], [name] ([id number]) -------------------------------------------------------------------------------- Wound Assessment Details Patient Name: [name] Date of Service: 03/17/2018 2:30 PM Medical Record Number: [id number] Patient Account Number: [id number] Date of Birth/Sex: 1971/05/08 (47 y.o. F) Treating RN: Phillis Haggis Primary Care Madelynne Lasker: [name] Other Clinician: Referring Tiani Stanbery: [name] Treating Alisson Rozell/Extender: [name] in Treatment: 6 Wound Status Wound Number: 1 Primary Etiology: Venous Leg Ulcer Wound Location: Left, Lateral Lower Leg Wound Status: Healed - Epithelialized Wounding Event: Gradually Appeared Comorbid History: Type II Diabetes Date Acquired: 01/19/2018 Weeks Of Treatment: 6 Clustered Wound: No Wound Measurements Length: (cm) 0 % Reduct Width: (cm) 0 % Reduct Depth: (cm) 0 Epitheli Area: (cm) 0 Tunneli Volume: (cm) 0 Undermi  ion in Area: 100% ion in Volume: 100% alization: Small (1-33%) ng: No ning: No Wound Description Full Thickness Without Exposed  Support Foul Odo Classification: Structures Slough/F Wound Margin: Flat and Intact Exudate Large Amount: Exudate Type: Serosanguineous Exudate Color: red, brown r After Cleansing: No ibrino Yes Wound Bed Granulation Amount: Medium (34-66%) Exposed Structure Granulation Quality: Red, Pink Fascia Exposed: No Necrotic Amount: Medium (34-66%) Fat Layer (Subcutaneous Tissue) Exposed: Yes Necrotic Quality: Adherent Slough Tendon Exposed: No Muscle Exposed: No Joint Exposed: No Bone Exposed: No Periwound Skin Texture Texture Color No Abnormalities Noted: No No Abnormalities Noted: No Callus: No Atrophie Blanche: No Crepitus: No Cyanosis: No Excoriation: No Ecchymosis: Yes Induration: No Erythema: No Rash: No Hemosiderin Staining: No Scarring: No Mottled: No Pallor: No Moisture Rubor: No No Abnormalities Noted: No Dry / Scaly: No Temperature / Pain [name], [name] ([id number]) Maceration: No Temperature: No Abnormality Tenderness on Palpation: Yes Wound Preparation Ulcer Cleansing: Rinsed/Irrigated with Saline, Other: soap and water, Topical Anesthetic Applied: Other: lidocaine 4%, Electronic Signature(s) Signed: 03/18/2018 3:54:45 PM By: [name] Mulling Entered By: [name] Mulling on 03/17/2018 15:12:21 [name], [name] ([id number]) -------------------------------------------------------------------------------- Vitals Details Patient Name: [name] Date of Service: 03/17/2018 2:30 PM Medical Record Number: [id number] Patient Account Number: [id number] Date of Birth/Sex: 01/20/1971 (47 y.o. F) Treating RN: [name] Primary Care Trena Dunavan: [name] Other Clinician: Referring Hazely Sealey: [name] Treating Briena Swingler/Extender: [name] in Treatment: 6 Vital Signs Time Taken: 14:53 Temperature (F): 98.4 Height (in): 65 Pulse (bpm): 86 Weight (lbs): 166.8 Respiratory Rate (breaths/min): 16 Body Mass Index (BMI): 27.8 Blood Pressure  (mmHg): 104/68 Reference Range: 80 - 120 mg / dl Electronic Signature(s) Signed: 03/18/2018 4:18:40 PM By: [name] Entered By: [name] on 03/17/2018 14:53:39

## 2018-03-30 NOTE — Progress Notes (Signed)
[place], [name] ([id number]) Visit Report for 03/17/2018 Chief Complaint Document Details Patient Name: [name], [name] Date of Service: 03/17/2018 2:30 PM Medical Record Number: [id number] Patient Account Number: [id number] Date of Birth/Sex: 1970-10-12 (47 y.o. F) Treating RN: Phillis Haggis Primary Care Provider: Sandrea Hughs Other Clinician: Referring Provider: Sandrea Hughs Treating Provider/Extender: Kathreen Cosier in Treatment: 6 Information Obtained from: Patient Chief Complaint patient is here for review of wound on her left lateral calf distally Electronic Signature(s) Signed: 03/17/2018 3:15:32 PM By: Bonnell Public Entered By: Bonnell Public on 03/17/2018 15:15:31 [name], [name] ([id number]) -------------------------------------------------------------------------------- HPI Details Patient Name: [name] Date of Service: 03/17/2018 2:30 PM Medical Record Number: [id number] Patient Account Number: [id number] Date of Birth/Sex: [birth year] (47 y.o. F) Treating RN: Phillis Haggis Primary Care Provider: Sandrea Hughs Other Clinician: Referring Provider: Sandrea Hughs Treating Provider/Extender: Kathreen Cosier in Treatment: 6 History of Present Illness HPI Description: ADMISSION 02/02/18 This is a 47 year old woman who is a poorly controlled type 2 diabetes with a recent hemoglobin A1c at the end of June/19 of 13.9. She's had medication adjustments made by her primary physician. I think her compliance with insulin has been poor. She tells me that 2 weeks ago she noticed a burning or stinging sensation on the left lateral leg. She thought something had better and then the area began itching. Her doctor prescribes some form of topical cream. She was also started on cephalexin and perhaps miconazole by her primary physician on July 9. She does not describe claudication. She is not a smoker. She does not have a wound history ABIs in our clinic were 0.9 on the right and  1.09 on the left 02/09/18; wound is about the same in terms of dimensions. Surface looks slightly better this week using Iodoflex under compression. She has poorly controlled diabetes but is working on this. I don't believe she has an arterial issue 02/16/18; it is just above the left mediall malleolus. This is improved. I don't think we need to flex at this point change to collagen 02/23/18;the patient's wound has improved in terms of dimensions. Healthy granulation. Using collagen/Prisma 03/02/18-She is seen in follow-up evaluation for a left lateral lower extremity wound. There is improvement. We will continue with collagen and compression wrap and she'll be seen next week 03/09/18-She is seen in follow-up evaluation of the left lower extremity wound. She did remove her compression wrap over the weekend to attend a baby shower. The wound continues to improve. We will continue with same treatment plan and she will follow-up next week; she is close to being healed 03/17/18-She is seen in follow-up correlation for a left lateral lower extremity wound. She is healed and will be discharged from the wound clinic today Electronic Signature(s) Signed: 03/17/2018 3:15:55 PM By: Bonnell Public Entered By: Bonnell Public on 03/17/2018 15:15:55 [name], [name] ([id number]) -------------------------------------------------------------------------------- Physician Orders Details Patient Name: [name] Date of Service: 03/17/2018 2:30 PM Medical Record Number: [id number] Patient Account Number: [id number] Date of Birth/Sex: [birth year] (47 y.o. F) Treating RN: Phillis Haggis Primary Care Provider: Sandrea Hughs Other Clinician: Referring Provider: Sandrea Hughs Treating Provider/Extender: Kathreen Cosier in Treatment: 6 Verbal / Phone Orders: Yes Clinician: Ashok Cordia, Debi Read Back and Verified: Yes Diagnosis Coding Discharge From [facility] Services o Discharge from Wound Care Center - Please contact our  office if you have any questions or concerns. Electronic Signature(s) Signed: 03/18/2018 3:54:45 PM By: Alejandro Mulling Signed: 03/29/2018 5:47:54 PM By: Bonnell Public Entered By: Alejandro Mulling on 03/17/2018 15:12:05 [name], [name] (  [id number]) -------------------------------------------------------------------------------- Problem List Details Patient Name: [name], [name] Date of Service: 03/17/2018 2:30 PM Medical Record Number: [id number] Patient Account Number: [id number] Date of Birth/Sex: 1971-01-16 (47 y.o. F) Treating RN: Phillis Haggis Primary Care Provider: Sandrea Hughs Other Clinician: Referring Provider: Sandrea Hughs Treating Provider/Extender: Kathreen Cosier in Treatment: 6 Active Problems ICD-10 Evaluated Encounter Code Description Active Date Today Diagnosis L97.222 Non-pressure chronic ulcer of left calf with fat layer exposed 02/02/2018 No Yes I87.311 Chronic venous hypertension (idiopathic) with ulcer of right 02/02/2018 No Yes lower extremity E11.622 Type 2 diabetes mellitus with other skin ulcer 02/02/2018 No Yes E11.42 Type 2 diabetes mellitus with diabetic polyneuropathy 02/02/2018 No Yes Inactive Problems Resolved Problems Electronic Signature(s) Signed: 03/17/2018 3:15:09 PM By: Bonnell Public Entered By: Bonnell Public on 03/17/2018 15:15:09 [name], [name] ([id number]) -------------------------------------------------------------------------------- Progress Note Details Patient Name: [name] Date of Service: 03/17/2018 2:30 PM Medical Record Number: [id number] Patient Account Number: [id number] Date of Birth/Sex: October 05, 1970 (47 y.o. F) Treating RN: Phillis Haggis Primary Care Provider: Sandrea Hughs Other Clinician: Referring Provider: Sandrea Hughs Treating Provider/Extender: Kathreen Cosier in Treatment: 6 Subjective Chief Complaint Information obtained from Patient patient is here for review of wound on her left lateral calf  distally History of Present Illness (HPI) ADMISSION 02/02/18 This is a 47 year old woman who is a poorly controlled type 2 diabetes with a recent hemoglobin A1c at the end of June/19 of 13.9. She's had medication adjustments made by her primary physician. I think her compliance with insulin has been poor. She tells me that 2 weeks ago she noticed a burning or stinging sensation on the left lateral leg. She thought something had better and then the area began itching. Her doctor prescribes some form of topical cream. She was also started on cephalexin and perhaps miconazole by her primary physician on July 9. She does not describe claudication. She is not a smoker. She does not have a wound history ABIs in our clinic were 0.9 on the right and 1.09 on the left 02/09/18; wound is about the same in terms of dimensions. Surface looks slightly better this week using Iodoflex under compression. She has poorly controlled diabetes but is working on this. I don't believe she has an arterial issue 02/16/18; it is just above the left mediall malleolus. This is improved. I don't think we need to flex at this point change to collagen 02/23/18;the patient's wound has improved in terms of dimensions. Healthy granulation. Using collagen/Prisma 03/02/18-She is seen in follow-up evaluation for a left lateral lower extremity wound. There is improvement. We will continue with collagen and compression wrap and she'll be seen next week 03/09/18-She is seen in follow-up evaluation of the left lower extremity wound. She did remove her compression wrap over the weekend to attend a baby shower. The wound continues to improve. We will continue with same treatment plan and she will follow-up next week; she is close to being healed 03/17/18-She is seen in follow-up correlation for a left lateral lower extremity wound. She is healed and will be discharged from the wound clinic today Objective Constitutional Vitals Time Taken:  2:53 PM, Height: 65 in, Weight: 166.8 lbs, BMI: 27.8, Temperature: 98.4 F, Pulse: 86 bpm, Respiratory Rate: 16 breaths/min, Blood Pressure: 104/68 mmHg. Integumentary (Hair, Skin) Wound #1 status is Healed - Epithelialized. Original cause of wound was Gradually Appeared. The wound is located on the [place], [name] ([id number]) Left,Lateral Lower Leg. The wound measures 0cm length x 0cm width x 0cm depth; 0cm^2 area and  0cm^3 volume. There is Fat Layer (Subcutaneous Tissue) Exposed exposed. There is no tunneling or undermining noted. There is a large amount of serosanguineous drainage noted. The wound margin is flat and intact. There is medium (34-66%) red, pink granulation within the wound bed. There is a medium (34-66%) amount of necrotic tissue within the wound bed including Adherent Slough. The periwound skin appearance exhibited: Ecchymosis. The periwound skin appearance did not exhibit: Callus, Crepitus, Excoriation, Induration, Rash, Scarring, Dry/Scaly, Maceration, Atrophie Blanche, Cyanosis, Hemosiderin Staining, Mottled, Pallor, Rubor, Erythema. Periwound temperature was noted as No Abnormality. The periwound has tenderness on palpation. Assessment Active Problems ICD-10 Non-pressure chronic ulcer of left calf with fat layer exposed Chronic venous hypertension (idiopathic) with ulcer of right lower extremity Type 2 diabetes mellitus with other skin ulcer Type 2 diabetes mellitus with diabetic polyneuropathy Plan Discharge From [facility] Services: Discharge from Wound Care Center - Please contact our office if you have any questions or concerns. Electronic Signature(s) Signed: 03/17/2018 3:16:07 PM By: Bonnell Public Entered By: Bonnell Public on 03/17/2018 15:16:07 [name], [name] ([id number]) -------------------------------------------------------------------------------- SuperBill Details Patient Name: [name] Date of Service: 03/17/2018 Medical Record Number: [id number] Patient Account  Number: [id number] Date of Birth/Sex: 10-20-1970 (47 y.o. F) Treating RN: Phillis Haggis Primary Care Provider: Sandrea Hughs Other Clinician: Referring Provider: Sandrea Hughs Treating Provider/Extender: Kathreen Cosier in Treatment: 6 Diagnosis Coding ICD-10 Codes Code Description [phone] Non-pressure chronic ulcer of left calf with fat layer exposed I87.311 Chronic venous hypertension (idiopathic) with ulcer of right lower extremity E11.622 Type 2 diabetes mellitus with other skin ulcer E11.42 Type 2 diabetes mellitus with diabetic polyneuropathy Facility Procedures CPT4 Code: 21308657 Description: [phone] - WOUND CARE VISIT-LEV 2 EST PT Modifier: Quantity: 1 Physician Procedures CPT4 Code: 2952841 Description: 32440 - WC PHYS LEVEL 2 - EST PT ICD-10 Diagnosis Description L97.222 Non-pressure chronic ulcer of left calf with fat layer exposed I87.311 Chronic venous hypertension (idiopathic) with ulcer of right l Modifier: ower extremity Quantity: 1 Electronic Signature(s) Signed: 03/17/2018 4:40:43 PM By: Alejandro Mulling Signed: 03/29/2018 5:47:54 PM By: Bonnell Public Previous Signature: 03/17/2018 3:16:26 PM Version By: Bonnell Public Entered By: Alejandro Mulling on 03/17/2018 16:40:43

## 2018-06-28 ENCOUNTER — Emergency Department
Admission: EM | Admit: 2018-06-28 | Discharge: 2018-06-28 | Disposition: A | Discharge status: Home / Self Care | Payer: PRIVATE HEALTH INSURANCE | Attending: Emergency Medicine | Admitting: Emergency Medicine

## 2018-06-28 ENCOUNTER — Encounter: Payer: Self-pay | Admitting: Intensive Care

## 2018-06-28 ENCOUNTER — Other Ambulatory Visit: Payer: Self-pay

## 2018-06-28 DIAGNOSIS — Y939 Activity, unspecified: Secondary | ICD-10-CM | POA: Insufficient documentation

## 2018-06-28 DIAGNOSIS — E119 Type 2 diabetes mellitus without complications: Secondary | ICD-10-CM | POA: Diagnosis not present

## 2018-06-28 DIAGNOSIS — Z79899 Other long term (current) drug therapy: Secondary | ICD-10-CM | POA: Diagnosis not present

## 2018-06-28 DIAGNOSIS — Y990 Civilian activity done for income or pay: Secondary | ICD-10-CM | POA: Insufficient documentation

## 2018-06-28 DIAGNOSIS — Z7984 Long term (current) use of oral hypoglycemic drugs: Secondary | ICD-10-CM | POA: Diagnosis not present

## 2018-06-28 DIAGNOSIS — T22211A Burn of second degree of right forearm, initial encounter: Secondary | ICD-10-CM | POA: Diagnosis not present

## 2018-06-28 DIAGNOSIS — T22011A Burn of unspecified degree of right forearm, initial encounter: Secondary | ICD-10-CM | POA: Diagnosis present

## 2018-06-28 DIAGNOSIS — X101XXA Contact with hot food, initial encounter: Secondary | ICD-10-CM | POA: Diagnosis not present

## 2018-06-28 DIAGNOSIS — Y929 Unspecified place or not applicable: Secondary | ICD-10-CM | POA: Insufficient documentation

## 2018-06-28 HISTORY — DX: Type 2 diabetes mellitus without complications: E11.9

## 2018-06-28 MED ORDER — SILVER SULFADIAZINE 1 % EX CREA: TOPICAL_CREAM | CUTANEOUS | 1 refills | Status: AC

## 2018-06-28 MED ORDER — TRAMADOL HCL 50 MG PO TABS
50.0000 mg | ORAL_TABLET | Freq: Once | ORAL | Status: AC
  Administered 2018-06-28: 50 mg via ORAL
  Filled 2018-06-28: qty 1

## 2018-06-28 MED ORDER — TRAMADOL HCL 50 MG PO TABS: 50.0000 mg | ORAL_TABLET | Freq: Two times a day (BID) | ORAL | 0 refills | Status: AC | PRN

## 2018-06-28 MED ORDER — SILVER SULFADIAZINE 1 % EX CREA
TOPICAL_CREAM | Freq: Once | CUTANEOUS | Status: AC
  Administered 2018-06-28: 09:00:00 via TOPICAL

## 2018-06-28 MED ORDER — SILVER SULFADIAZINE 1 % EX CREA
TOPICAL_CREAM | CUTANEOUS | Status: AC
  Filled 2018-06-28: qty 85

## 2018-06-28 NOTE — ED Notes (Addendum)
Pt in NAD at this time. Full sensation noted to area below the burn. Pt c/o burning pain and ranks it 10/10 at this time.
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
## 2018-06-28 NOTE — ED Notes (Signed)
Spoke with [place]any Cassetari in dining services at [phone]. She was unsure if pt needed UDS or breath analysis preformed. [place] spoke with Will regarding pt and will try to get in contact with him again. This tech attempted to call [facility] and got answering machine of Threasa BeardsVanessa Corbett who is currently out of the office.

## 2018-07-06 ENCOUNTER — Encounter: Payer: PRIVATE HEALTH INSURANCE | Attending: Internal Medicine | Admitting: Internal Medicine

## 2018-07-06 DIAGNOSIS — Z794 Long term (current) use of insulin: Secondary | ICD-10-CM | POA: Insufficient documentation

## 2018-07-06 DIAGNOSIS — E11622 Type 2 diabetes mellitus with other skin ulcer: Secondary | ICD-10-CM | POA: Diagnosis not present

## 2018-07-06 DIAGNOSIS — L97929 Non-pressure chronic ulcer of unspecified part of left lower leg with unspecified severity: Secondary | ICD-10-CM | POA: Insufficient documentation

## 2018-07-06 DIAGNOSIS — T23271A Burn of second degree of right wrist, initial encounter: Secondary | ICD-10-CM | POA: Diagnosis not present

## 2018-07-06 DIAGNOSIS — L819 Disorder of pigmentation, unspecified: Secondary | ICD-10-CM | POA: Diagnosis not present

## 2018-07-13 ENCOUNTER — Encounter: Payer: PRIVATE HEALTH INSURANCE | Admitting: Internal Medicine

## 2018-07-13 DIAGNOSIS — E11622 Type 2 diabetes mellitus with other skin ulcer: Secondary | ICD-10-CM | POA: Diagnosis not present

## 2018-07-13 DIAGNOSIS — I959 Hypotension, unspecified: Secondary | ICD-10-CM | POA: Diagnosis not present

## 2018-07-13 DIAGNOSIS — T23071A Burn of unspecified degree of right wrist, initial encounter: Secondary | ICD-10-CM | POA: Diagnosis not present
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
## 2018-09-29 DIAGNOSIS — E113393 Type 2 diabetes mellitus with moderate nonproliferative diabetic retinopathy without macular edema, bilateral: Secondary | ICD-10-CM | POA: Diagnosis not present

## 2018-12-15 DIAGNOSIS — R300 Dysuria: Secondary | ICD-10-CM | POA: Diagnosis not present

## 2019-04-07 DIAGNOSIS — E1165 Type 2 diabetes mellitus with hyperglycemia: Secondary | ICD-10-CM | POA: Diagnosis not present

## 2019-04-07 DIAGNOSIS — Z1329 Encounter for screening for other suspected endocrine disorder: Secondary | ICD-10-CM | POA: Diagnosis not present

## 2019-09-18 DIAGNOSIS — S80811A Abrasion, right lower leg, initial encounter: Secondary | ICD-10-CM | POA: Diagnosis not present

## 2019-09-18 DIAGNOSIS — L089 Local infection of the skin and subcutaneous tissue, unspecified: Secondary | ICD-10-CM | POA: Diagnosis not present

## 2019-09-20 DIAGNOSIS — Z1389 Encounter for screening for other disorder: Secondary | ICD-10-CM | POA: Diagnosis not present

## 2019-09-20 DIAGNOSIS — E1165 Type 2 diabetes mellitus with hyperglycemia: Secondary | ICD-10-CM | POA: Diagnosis not present

## 2019-09-20 DIAGNOSIS — T1490XA Injury, unspecified, initial encounter: Secondary | ICD-10-CM | POA: Diagnosis not present

## 2019-09-26 DIAGNOSIS — E1165 Type 2 diabetes mellitus with hyperglycemia: Secondary | ICD-10-CM | POA: Diagnosis not present

## 2019-09-26 DIAGNOSIS — T1490XA Injury, unspecified, initial encounter: Secondary | ICD-10-CM | POA: Diagnosis not present

## 2019-10-02 DIAGNOSIS — H2511 Age-related nuclear cataract, right eye: Secondary | ICD-10-CM | POA: Diagnosis not present

## 2019-10-04 ENCOUNTER — Encounter: Payer: 59 | Attending: Internal Medicine | Admitting: Internal Medicine

## 2019-10-04 ENCOUNTER — Other Ambulatory Visit: Payer: Self-pay

## 2019-10-04 DIAGNOSIS — Z7984 Long term (current) use of oral hypoglycemic drugs: Secondary | ICD-10-CM | POA: Diagnosis not present

## 2019-10-04 DIAGNOSIS — L97812 Non-pressure chronic ulcer of other part of right lower leg with fat layer exposed: Secondary | ICD-10-CM | POA: Diagnosis not present

## 2019-10-04 DIAGNOSIS — E11621 Type 2 diabetes mellitus with foot ulcer: Secondary | ICD-10-CM | POA: Insufficient documentation

## 2019-10-04 DIAGNOSIS — I872 Venous insufficiency (chronic) (peripheral): Secondary | ICD-10-CM | POA: Diagnosis not present

## 2019-10-04 DIAGNOSIS — E11622 Type 2 diabetes mellitus with other skin ulcer: Secondary | ICD-10-CM | POA: Diagnosis not present

## 2019-10-04 DIAGNOSIS — S80811D Abrasion, right lower leg, subsequent encounter: Secondary | ICD-10-CM | POA: Diagnosis not present

## 2019-10-04 NOTE — Progress Notes (Signed)
[place], [name] ([id number]) Visit Report for 10/04/2019 Abuse/Suicide Risk Screen Details Patient Name: [name], [name] Date of Service: 10/04/2019 9:45 AM Medical Record Number: [id number] Patient Account Number: [id number] Date of Birth/Sex: Sep 12, 1970 (49 y.o. F) Treating RN: Rodell Perna Primary Care Ameisha Mcclellan: Sandrea Hughs Other Clinician: Referring Xaivier Malay: Referral, Self Treating Bayle Calvo/Extender: Altamese [place] in Treatment: 0 Abuse/Suicide Risk Screen Items Answer ABUSE RISK SCREEN: Has anyone close to you tried to hurt or harm you recentlyo No Do you feel uncomfortable with anyone in your familyo No Has anyone forced you do things that you didnot want to doo No Electronic Signature(s) Signed: 10/04/2019 11:25:05 AM By: Rodell Perna Entered By: Rodell Perna on 10/04/2019 09:37:22 [place], Danyella ([id number]) -------------------------------------------------------------------------------- Activities of Daily Living Details Patient Name: [name] Date of Service: 10/04/2019 9:45 AM Medical Record Number: [id number] Patient Account Number: [id number] Date of Birth/Sex: Oct 29, 1970 (49 y.o. F) Treating RN: Rodell Perna Primary Care Kentley Cedillo: Sandrea Hughs Other Clinician: Referring Geneen Dieter: Referral, Self Treating Burnell Hurta/Extender: Altamese [place] in Treatment: 0 Activities of Daily Living Items Answer Activities of Daily Living (Please select one for each item) Drive Automobile Completely Able Take Medications Completely Able Use Telephone Completely Able Care for Appearance Completely Able Use Toilet Completely Able Bath / Shower Completely Able Dress Self Completely Able Feed Self Completely Able Walk Completely Able Get In / Out Bed Completely Able Housework Completely Able Prepare Meals Completely Able Handle Money Completely Able Shop for Self Completely Able Electronic Signature(s) Signed: 10/04/2019 11:25:05 AM By: Rodell Perna Entered By:  Rodell Perna on 10/04/2019 09:37:35 [name], [name] ([id number]) -------------------------------------------------------------------------------- Education Screening Details Patient Name: [name] Date of Service: 10/04/2019 9:45 AM Medical Record Number: [id number] Patient Account Number: [id number] Date of Birth/Sex: [birth year] (48 y.o. F) Treating RN: Rodell Perna Primary Care [place] Grosser: Sandrea Hughs Other Clinician: Referring Francisco Eyerly: Referral, Self Treating Intisar Claudio/Extender: Altamese [place] in Treatment: 0 Primary Learner Assessed: Patient Learning Preferences/Education Level/Primary Language Learning Preference: Explanation, Demonstration Highest Education Level: Grade School Preferred Language: Patient Declined Cognitive Barrier Language Barrier: No Translator Needed: No Memory Deficit: No Emotional Barrier: No Cultural/Religious Beliefs Affecting Medical Care: No Physical Barrier Impaired Vision: No Impaired Hearing: No Decreased Hand dexterity: No Knowledge/Comprehension Knowledge Level: High Comprehension Level: High Ability to understand written instructions: High Ability to understand verbal instructions: High Motivation Anxiety Level: Calm Cooperation: Cooperative Education Importance: Acknowledges Need Interest in Health Problems: Asks Questions Perception: Coherent Willingness to Engage in Self-Management High Activities: Readiness to Engage in Self-Management High Activities: Electronic Signature(s) Signed: 10/04/2019 11:25:05 AM By: Rodell Perna Entered By: Rodell Perna on 10/04/2019 09:37:50 [name], [name] ([id number]) -------------------------------------------------------------------------------- Fall Risk Assessment Details Patient Name: [name] Date of Service: 10/04/2019 9:45 AM Medical Record Number: [id number] Patient Account Number: [id number] Date of Birth/Sex: Dec 05, 1970 (48 y.o. F) Treating RN: Rodell Perna Primary Care  Minh Jasper: Sandrea Hughs Other Clinician: Referring Tomoki Lucken: Referral, Self Treating Yaslene Lindamood/Extender: Altamese [place] in Treatment: 0 Fall Risk Assessment Items Have you had 2 or more falls in the last 12 monthso 0 No Have you had any fall that resulted in injury in the last 12 monthso 0 No FALLS RISK SCREEN History of falling - immediate or within 3 months 0 No Secondary diagnosis (Do you have 2 or more medical diagnoseso) 0 No Ambulatory aid None/bed rest/wheelchair/nurse 0 No Crutches/cane/walker 0 No Furniture 0 No Intravenous therapy Access/Saline/Heparin Lock 0 No Gait/Transferring Normal/ bed rest/ wheelchair 0 No Weak (short steps with or without shuffle, stooped but able  to lift head while walking, may seek 0 No support from furniture) Impaired (short steps with shuffle, may have difficulty arising from chair, head down, impaired 0 No balance) Mental Status Oriented to own ability 0 No Electronic Signature(s) Signed: 10/04/2019 11:25:05 AM By: Army Melia Entered By: Army Melia on 10/04/2019 09:37:55 [name], [name] ([id number]) -------------------------------------------------------------------------------- Foot Assessment Details Patient Name: [name] Date of Service: 10/04/2019 9:45 AM Medical Record Number: [id number] Patient Account Number: [id number] Date of Birth/Sex: 1971-04-06 (49 y.o. F) Treating RN: Army Melia Primary Care Johnice Riebe: Freddy Finner Other Clinician: Referring Zyshonne Malecha: Referral, Self Treating Lamin Chandley/Extender: Tito Dine in Treatment: 0 Foot Assessment Items Site Locations + = Sensation present, - = Sensation absent, C = Callus, U = Ulcer R = Redness, W = Warmth, M = Maceration, PU = Pre-ulcerative lesion F = Fissure, S = Swelling, D = Dryness Assessment Right: Left: Other Deformity: No No Prior Foot Ulcer: No No Prior Amputation: No No Charcot Joint: No No Ambulatory Status: Ambulatory Without  Help Gait: Steady Electronic Signature(s) Signed: 10/04/2019 11:25:05 AM By: Army Melia Entered By: Army Melia on 10/04/2019 09:41:30 [place], [place] ([id number]) -------------------------------------------------------------------------------- Nutrition Risk Screening Details Patient Name: [name] Date of Service: 10/04/2019 9:45 AM Medical Record Number: [id number] Patient Account Number: [id number] Date of Birth/Sex: 08-22-1970 (49 y.o. F) Treating RN: Army Melia Primary Care Deyon Chizek: Freddy Finner Other Clinician: Referring Tavio Biegel: Referral, Self Treating Lindzie Boxx/Extender: Tito Dine in Treatment: 0 Height (in): 60 Weight (lbs): 164 Body Mass Index (BMI): 32 Nutrition Risk Screening Items Score Screening NUTRITION RISK SCREEN: I have an illness or condition that made me change the kind and/or amount of food I eat 0 No I eat fewer than two meals per day 0 No I eat few fruits and vegetables, or milk products 0 No I have three or more drinks of beer, liquor or wine almost every day 0 No I have tooth or mouth problems that make it hard for me to eat 0 No I don't always have enough money to buy the food I need 0 No I eat alone most of the time 0 No I take three or more different prescribed or over-the-counter drugs a day 0 No Without wanting to, I have lost or gained 10 pounds in the last six months 0 No I am not always physically able to shop, cook and/or feed myself 0 No Nutrition Protocols Good Risk Protocol 0 No interventions needed Moderate Risk Protocol High Risk Proctocol Risk Level: Good Risk Score: 0 Electronic Signature(s) Signed: 10/04/2019 11:25:05 AM By: Army Melia Entered By: Army Melia on 10/04/2019 09:38:01

## 2019-10-04 NOTE — Progress Notes (Signed)
BroseleyGARCIA, [name] ([id number]) Visit Report for 10/04/2019 Allergy List Details Patient Name: [name], [name] Date of Service: 10/04/2019 9:45 AM Medical Record Number: [id number] Patient Account Number: [id number] Date of Birth/Sex: 1971/01/13 (49 y.o. F) Treating RN: Rodell PernaScott, Dajea Primary Care Aditri Louischarles: Sandrea HughsUBIO, JESSICA Other Clinician: Referring Kathy Wares: Referral, Self Treating Ikeem Cleckler/Extender: Maxwell CaulOBSON, MICHAEL G Weeks in Treatment: 0 Allergies Active Allergies No Known Allergies Allergy Notes Electronic Signature(s) Signed: 10/04/2019 11:25:05 AM By: Rodell PernaScott, Dajea Entered By: Rodell PernaScott, Dajea on 10/04/2019 09:35:23 Roupp, Keshanna ([id number]) -------------------------------------------------------------------------------- Arrival Information Details Patient Name: [name], [name] Date of Service: 10/04/2019 9:45 AM Medical Record Number: [id number] Patient Account Number: [id number] Date of Birth/Sex: 1971/01/13 (48 y.o. F) Treating RN: Rodell PernaScott, Dajea Primary Care Prospero Mahnke: Sandrea HughsUBIO, JESSICA Other Clinician: Referring Garold Sheeler: Referral, Self Treating Bernadine Melecio/Extender: Altamese CarolinaOBSON, MICHAEL G Weeks in Treatment: 0 Visit Information Patient Arrived: Ambulatory Arrival Time: 09:33 Accompanied By: self Transfer Assistance: None Patient Identification Verified: Yes History Since Last Visit Added or deleted any medications: No Any new allergies or adverse reactions: No Had a fall or experienced change in activities of daily living that may affect risk of falls: No Signs or symptoms of abuse/neglect since last visito No Hospitalized since last visit: No Has Dressing in Place as Prescribed: Yes Electronic Signature(s) Signed: 10/04/2019 11:25:05 AM By: Rodell PernaScott, Dajea Entered By: Rodell PernaScott, Dajea on 10/04/2019 09:34:04 [place], Codie ([id number]) -------------------------------------------------------------------------------- Clinic Level of Care Assessment Details Patient Name: [name], [name] Date of Service:  10/04/2019 9:45 AM Medical Record Number: [id number] Patient Account Number: [id number] Date of Birth/Sex: 1971/01/13 (49 y.o. F) Treating RN: Huel CoventryWoody, Kim Primary Care Marisah Laker: Sandrea HughsUBIO, JESSICA Other Clinician: Referring Florabel Faulks: Referral, Self Treating Shawnetta Lein/Extender: Altamese CarolinaOBSON, MICHAEL G Weeks in Treatment: 0 Clinic Level of Care Assessment Items TOOL 2 Quantity Score []  - Use when only an EandM is performed on the INITIAL visit 0 ASSESSMENTS - Nursing Assessment / Reassessment X - General Physical Exam (combine w/ comprehensive assessment (listed just below) when performed on new pt. 1 20 evals) X- 1 25 Comprehensive Assessment (HX, ROS, Risk Assessments, Wounds Hx, etc.) ASSESSMENTS - Wound and Skin Assessment / Reassessment []  - Simple Wound Assessment / Reassessment - one wound 0 X- 2 5 Complex Wound Assessment / Reassessment - multiple wounds []  - 0 Dermatologic / Skin Assessment (not related to wound area) ASSESSMENTS - Ostomy and/or Continence Assessment and Care []  - Incontinence Assessment and Management 0 []  - 0 Ostomy Care Assessment and Management (repouching, etc.) PROCESS - Coordination of Care X - Simple Patient / Family Education for ongoing care 1 15 []  - 0 Complex (extensive) Patient / Family Education for ongoing care X- 1 10 Staff obtains ChiropractorConsents, Records, Test Results / Process Orders []  - 0 Staff telephones HHA, Nursing Homes / Clarify orders / etc []  - 0 Routine Transfer to another Facility (non-emergent condition) []  - 0 Routine Hospital Admission (non-emergent condition) X- 1 15 New Admissions / Manufacturing engineernsurance Authorizations / Ordering NPWT, Apligraf, etc. []  - 0 Emergency Hospital Admission (emergent condition) []  - 0 Simple Discharge Coordination []  - 0 Complex (extensive) Discharge Coordination PROCESS - Special Needs []  - Pediatric / Minor Patient Management 0 []  - 0 Isolation Patient Management []  - 0 Hearing / Language / Visual special  needs []  - 0 Assessment of Community assistance (transportation, D/C planning, etc.) []  - 0 Additional assistance / Altered mentation []  - 0 Support Surface(s) Assessment (bed, cushion, seat, etc.) INTERVENTIONS - Wound Cleansing / Measurement X - Wound Imaging (photographs - any number of wounds) 1 5  [place], Patte ([id number]) []  - 0 Wound Tracing (instead of photographs) []  - 0 Simple Wound Measurement - one wound X- 2 5 Complex Wound Measurement - multiple wounds []  - 0 Simple Wound Cleansing - one wound X- 2 5 Complex Wound Cleansing - multiple wounds INTERVENTIONS - Wound Dressings []  - Small Wound Dressing one or multiple wounds 0 []  - 0 Medium Wound Dressing one or multiple wounds X- 1 20 Large Wound Dressing one or multiple wounds []  - 0 Application of Medications - injection INTERVENTIONS - Miscellaneous []  - External ear exam 0 []  - 0 Specimen Collection (cultures, biopsies, blood, body fluids, etc.) []  - 0 Specimen(s) / Culture(s) sent or taken to Lab for analysis []  - 0 Patient Transfer (multiple staff / / Similar devices) []  - 0 Simple Staple / Suture removal (25 or less) []  - 0 Complex Staple / Suture removal (26 or more) []  - 0 Hypo / Hyperglycemic Management (close monitor of Blood Glucose) X- 1 15 Ankle / Brachial Index (ABI) - do not check if billed separately Has the patient been seen at the hospital within the last three years: Yes Total Score: 155 Level Of Care: New/Established - Level 4 Electronic Signature(s) Signed: 10/04/2019 5:22:30 PM By: , BSN, RN, CWS, Kim RN, BSN Entered By: , BSN, RN, CWS, Kim on 10/04/2019 10:05:33 [place], Tyianna ( ) -------------------------------------------------------------------------------- Compression Therapy Details Patient Name: Date of Service: 10/04/2019 9:45 AM Medical Record Number: Patient Account Number: Nurse, adult Date of Birth/Sex: [birth year] (48 y.o.  F) Treating RN: Primary Care Jobina Maita: Other Clinician: Referring Chigozie Basaldua: Referral, Self Treating Raniah Karan/Extender: 12/04/2019 in Treatment: 0 Compression Therapy Performed for Wound Assessment: Wound #3 Right,Proximal,Anterior Lower Leg Performed By: Clinician Elliot Gurney, RN Compression Type: Three Layer Pre Treatment ABI: 1.2 Post Procedure Diagnosis Same as Pre-procedure Electronic Signature(s) Signed: 10/04/2019 5:22:30 PM By: 12/04/2019, BSN, RN, CWS, Kim RN, BSN Entered By: Nuussuaq, BSN, RN, CWS, Kim on 10/04/2019 10:06:00 [place], Taressa (12/04/2019) -------------------------------------------------------------------------------- Compression Therapy Details Patient Name: [id number] Date of Service: 10/04/2019 9:45 AM Medical Record Number: 07/22/1971 Patient Account Number: Huel Coventry Date of Birth/Sex: 15-Nov-1970 (48 y.o. F) Treating RN: Altamese Garland Primary Care Joei Frangos: Huel Coventry Other Clinician: Referring Ceazia Harb: Referral, Self Treating Gavin Faivre/Extender: 12/04/2019 in Treatment: 0 Compression Therapy Performed for Wound Assessment: Wound #4 Right,Distal,Anterior Lower Leg Performed By: Clinician Elliot Gurney, RN Compression Type: Three Layer Pre Treatment ABI: 1.2 Post Procedure Diagnosis Same as Pre-procedure Electronic Signature(s) Signed: 10/04/2019 5:22:30 PM By: 12/04/2019, BSN, RN, CWS, Kim RN, BSN Entered By: Nuussuaq, BSN, RN, CWS, Kim on 10/04/2019 10:06:00 [place], [place] (12/04/2019) -------------------------------------------------------------------------------- Encounter Discharge Information Details Patient Name: [id number] Date of Service: 10/04/2019 9:45 AM Medical Record Number: 07/22/1971 Patient Account Number: Huel Coventry Date of Birth/Sex: [birth year] (48 y.o. F) Treating RN: Huel Coventry Primary Care Zimal Weisensel: 12/04/2019 Other Clinician: Referring Zahli Vetsch: Referral, Self Treating Chestine [place]knap/Extender:  Elliot Gurney in Treatment: 0 Encounter Discharge Information Items Post Procedure Vitals Discharge Condition: Stable Temperature (F): 98.6 Ambulatory Status: Ambulatory Pulse (bpm): 93 Discharge Destination: Home Respiratory Rate (breaths/min): 16 Transportation: Private Auto Blood Pressure (mmHg): 142/76 Accompanied By: self Schedule Follow-up Appointment: Yes Clinical Summary of Care: Electronic Signature(s) Signed: 10/04/2019 5:22:30 PM By: 12/04/2019, BSN, RN, CWS, Kim RN, BSN Entered By: Nuussuaq, BSN, RN, CWS, Kim on 10/04/2019 10:07:55 [place], Eunie (12/04/2019) -------------------------------------------------------------------------------- Lower Extremity Assessment Details Patient Name: [id number] Date of Service: 10/04/2019 9:45 AM Medical  Record Number: [id number] Patient Account Number: [id number] Date of Birth/Sex: 08/10/1970 (49 y.o. F) Treating RN: Rodell Perna Primary Care Claire Dolores: Sandrea Hughs Other Clinician: Referring Osmani Kersten: Referral, Self Treating Dovber Ernest/Extender: Maxwell Caul Weeks in Treatment: 0 Edema Assessment Assessed: [Left: No] [Right: No] Edema: [Left: No] [Right: No] Calf Left: Right: Point of Measurement: 27 cm From Medial Instep 36 cm 36 cm Ankle Left: Right: Point of Measurement: 9 cm From Medial Instep 21 cm 21.5 cm Vascular Assessment Pulses: Dorsalis Pedis Palpable: [Left:Yes] [Right:Yes] Doppler Audible: [Right:Yes] Posterior Tibial Palpable: [Right:Yes] Doppler Audible: [Right:Yes] Blood Pressure: Brachial: [Right:110] Dorsalis Pedis: 132 Ankle: Posterior Tibial: 130 Ankle Brachial Index: [Right:1.20] Electronic Signature(s) Signed: 10/04/2019 11:25:05 AM By: Rodell Perna Entered By: Rodell Perna on 10/04/2019 09:51:41 [place], [name] ([id number]) -------------------------------------------------------------------------------- Multi Wound Chart Details Patient Name: [name] Date of Service: 10/04/2019 9:45  AM Medical Record Number: [id number] Patient Account Number: [id number] Date of Birth/Sex: 26-Jul-1971 (49 y.o. F) Treating RN: Huel Coventry Primary Care Avonelle Viveros: Sandrea Hughs Other Clinician: Referring Johnhenry Tippin: Referral, Self Treating Dolph Tavano/Extender: Altamese [place] in Treatment: 0 Vital Signs Height(in): 60 Pulse(bpm): 93 Weight(lbs): 164 Blood Pressure(mmHg): 142/76 Body Mass Index(BMI): 32 Temperature(F): 98.6 Respiratory Rate(breaths/min): 16 Photos: [N/A:N/A] Wound Location: Right Lower Leg - Anterior, Proximal Right Lower Leg - Anterior, Distal N/A Wounding Event: Trauma Trauma N/A Primary Etiology: Diabetic Wound/Ulcer of the Lower Diabetic Wound/Ulcer of the Lower N/A Extremity Extremity Comorbid History: Type II Diabetes Type II Diabetes N/A Date Acquired: 09/05/2019 09/05/2019 N/A Weeks of Treatment: 0 0 N/A Wound Status: Open Open N/A Measurements L x W x D (cm) 0.5x1x0.1 2x1.2x0.1 N/A Area (cm) : 0.393 1.885 N/A Volume (cm) : 0.039 0.188 N/A Classification: Grade 2 Grade 2 N/A Exudate Amount: Medium Medium N/A Exudate Type: Serosanguineous Serosanguineous N/A Exudate Color: red, brown red, brown N/A Wound Margin: Flat and Intact Flat and Intact N/A Granulation Amount: Small (1-33%) Small (1-33%) N/A Granulation Quality: Red Red N/A Necrotic Amount: Large (67-100%) Large (67-100%) N/A Necrotic Tissue: Eschar Eschar N/A Exposed Structures: Fat Layer (Subcutaneous Tissue) Fat Layer (Subcutaneous Tissue) N/A Exposed: Yes Exposed: Yes Fascia: No Fascia: No Tendon: No Tendon: No Muscle: No Muscle: No Joint: No Joint: No Bone: No Bone: No Epithelialization: None None N/A Debridement: Debridement - Excisional Debridement - Excisional N/A Pre-procedure Verification/Time 10:00 10:00 N/A Out Taken: Pain Control: Lidocaine Lidocaine N/A Tissue Debrided: Subcutaneous, Slough Subcutaneous, Slough N/A Level: Skin/Subcutaneous Tissue Skin/Subcutaneous  Tissue N/A Debridement Area (sq cm): 0.5 2.4 N/A Instrument: Curette Curette N/A Bleeding: Moderate Moderate N/A Hemostasis Achieved: Pressure Pressure N/A [name], [name] ([id number]) Debridement Treatment Procedure was tolerated well Procedure was tolerated well N/A Response: Post Debridement Measurements 0.5x1x0.2 2x1.2x0.2 N/A L x W x D (cm) Post Debridement Volume: (cm) 0.079 0.377 N/A Procedures Performed: Compression Therapy Compression Therapy N/A Debridement Debridement Treatment Notes Wound #3 (Right, Proximal, Anterior Lower Leg) 1. Cleansed with: Clean wound with Normal Saline 2. Anesthetic Topical Lidocaine 4% cream to wound bed prior to debridement Hurricaine Topical Anesthetic Spray 4. Dressing Applied: Iodosorb Ointment 5. Secondary Dressing Applied ABD Pad 7. Secured with 3 Layer Compression System - Right Lower Extremity Wound #4 (Right, Distal, Anterior Lower Leg) 1. Cleansed with: Clean wound with Normal Saline 2. Anesthetic Topical Lidocaine 4% cream to wound bed prior to debridement Hurricaine Topical Anesthetic Spray 4. Dressing Applied: Iodosorb Ointment 5. Secondary Dressing Applied ABD Pad 7. Secured with 3 Layer Compression System - Right Lower Extremity Electronic Signature(s) Signed: 10/04/2019 5:25:14 PM By: Baltazar Najjar  MD Entered By: Baltazar Najjar on 10/04/2019 10:27:34 [name], [name] ([id number]) -------------------------------------------------------------------------------- Multi-Disciplinary Care Plan Details Patient Name: [name], [name] Date of Service: 10/04/2019 9:45 AM Medical Record Number: [id number] Patient Account Number: [id number] Date of Birth/Sex: [birth year] (49 y.o. F) Treating RN: Huel Coventry Primary Care Jori Frerichs: Sandrea Hughs Other Clinician: Referring Kiaja Shorty: Referral, Self Treating Jernee Murtaugh/Extender: Altamese [place] in Treatment: 0 Active Inactive Orientation to the Wound Care Program Nursing  Diagnoses: Knowledge deficit related to the wound healing center program Goals: Patient/caregiver will verbalize understanding of the Wound Healing Center Program Date Initiated: 10/04/2019 Target Resolution Date: 10/11/2019 Goal Status: Active Interventions: Provide education on orientation to the wound center Notes: Wound/Skin Impairment Nursing Diagnoses: Impaired tissue integrity Knowledge deficit related to ulceration/compromised skin integrity Goals: Ulcer/skin breakdown will have a volume reduction of 30% by week 4 Date Initiated: 10/04/2019 Target Resolution Date: 11/04/2019 Goal Status: Active Interventions: Assess patient/caregiver ability to obtain necessary supplies Assess ulceration(s) every visit Treatment Activities: Skin care regimen initiated : 10/04/2019 Topical wound management initiated : 10/04/2019 Notes: Electronic Signature(s) Signed: 10/04/2019 5:22:30 PM By: Elliot Gurney, BSN, RN, CWS, Kim RN, BSN Entered By: Elliot Gurney, BSN, RN, CWS, Kim on 10/04/2019 09:59:38 [name], [name] ([id number]) -------------------------------------------------------------------------------- Pain Assessment Details Patient Name: [name] Date of Service: 10/04/2019 9:45 AM Medical Record Number: [id number] Patient Account Number: [id number] Date of Birth/Sex: [birth year] (48 y.o. F) Treating RN: Rodell Perna Primary Care Keean Wilmeth: Sandrea Hughs Other Clinician: Referring Calleen Alvis: Referral, Self Treating Deretha Ertle/Extender: Altamese [place] in Treatment: 0 Active Problems Location of Pain Severity and Description of Pain Patient Has Paino Yes Site Locations Pain Location: Pain in Ulcers Rate the pain. Current Pain Level: 6 Pain Management and Medication Current Pain Management: Electronic Signature(s) Signed: 10/04/2019 11:25:05 AM By: Rodell Perna Entered By: Rodell Perna on 10/04/2019 09:34:17 [name], Vashti  ([id number]) -------------------------------------------------------------------------------- Patient/Caregiver Education Details Patient Name: [name] Date of Service: 10/04/2019 9:45 AM Medical Record Number: [id number] Patient Account Number: [id number] Date of Birth/Gender: Oct 01, 1970 (48 y.o. F) Treating RN: Huel Coventry Primary Care Physician: Sandrea Hughs Other Clinician: Referring Physician: Referral, Self Treating Physician/Extender: Altamese [place] in Treatment: 0 Education Assessment Education Provided To: Patient Education Topics Provided Venous: Handouts: Controlling Swelling with Multilayered Compression Wraps Methods: Demonstration, Explain/Verbal Responses: State content correctly Wound/Skin Impairment: Handouts: Caring for Your Ulcer Methods: Demonstration, Explain/Verbal Responses: State content correctly Electronic Signature(s) Signed: 10/04/2019 5:22:30 PM By: Elliot Gurney, BSN, RN, CWS, Kim RN, BSN Entered By: Elliot Gurney, BSN, RN, CWS, Kim on 10/04/2019 10:06:25 [name], [name] ([id number]) -------------------------------------------------------------------------------- Wound Assessment Details Patient Name: [name] Date of Service: 10/04/2019 9:45 AM Medical Record Number: [id number] Patient Account Number: [id number] Date of Birth/Sex: January 10, 1971 (48 y.o. F) Treating RN: Rodell Perna Primary Care Willam Munford: Sandrea Hughs Other Clinician: Referring Jazaria Jarecki: Referral, Self Treating Jariyah Hackley/Extender: Maxwell Caul Weeks in Treatment: 0 Wound Status Wound Number: 3 Primary Etiology: Diabetic Wound/Ulcer of the Lower Extremity Wound Location: Right Lower Leg - Anterior, Proximal Wound Status: Open Wounding Event: Trauma Comorbid History: Type II Diabetes Date Acquired: 09/05/2019 Weeks Of Treatment: 0 Clustered Wound: No Photos Wound Measurements Length: (cm) 0.5 Width: (cm) 1 Depth: (cm) 0.1 Area: (cm) 0.393 Volume: (cm) 0.039 % Reduction  in Area: % Reduction in Volume: Epithelialization: None Tunneling: No Undermining: No Wound Description Classification: Grade 2 Wound Margin: Flat and Intact Exudate Amount: Medium Exudate Type: Serosanguineous Exudate Color: red, brown Foul Odor After Cleansing: No Slough/Fibrino Yes Wound Bed Granulation Amount: Small (1-33%) Exposed Structure Granulation Quality: Red Fascia  Exposed: No Necrotic Amount: Large (67-100%) Fat Layer (Subcutaneous Tissue) Exposed: Yes Necrotic Quality: Eschar Tendon Exposed: No Muscle Exposed: No Joint Exposed: No Bone Exposed: No Treatment Notes Wound #3 (Right, Proximal, Anterior Lower Leg) 1. Cleansed with: Clean wound with Normal Saline 2. Anesthetic Topical Lidocaine 4% cream to wound bed prior to debridement Hurricaine Topical Anesthetic Spray 4. Dressing Applied: [name] [place], [name] ([id number]) Iodosorb Ointment 5. Secondary Dressing Applied ABD Pad 7. Secured with 3 Layer Compression System - Right Lower Extremity Electronic Signature(s) Signed: 10/04/2019 11:25:05 AM By: Army Melia Entered By: Army Melia on 10/04/2019 09:44:33 [name], [name] ([id number]) -------------------------------------------------------------------------------- Wound Assessment Details Patient Name: [name] Date of Service: 10/04/2019 9:45 AM Medical Record Number: [id number] Patient Account Number: [id number] Date of Birth/Sex: Sep 04, 1970 (49 y.o. F) Treating RN: Army Melia Primary Care Kinjal Neitzke: Freddy Finner Other Clinician: Referring Saleemah Mollenhauer: Referral, Self Treating Rogen Porte/Extender: Ricard Dillon Weeks in Treatment: 0 Wound Status Wound Number: 4 Primary Etiology: Diabetic Wound/Ulcer of the Lower Extremity Wound Location: Right Lower Leg - Anterior, Distal Wound Status: Open Wounding Event: Trauma Comorbid History: Type II Diabetes Date Acquired: 09/05/2019 Weeks Of Treatment: 0 Clustered Wound: No Photos Wound Measurements Length:  (cm) 2 Width: (cm) 1.2 Depth: (cm) 0.1 Area: (cm) 1.885 Volume: (cm) 0.188 % Reduction in Area: % Reduction in Volume: Epithelialization: None Tunneling: No Undermining: No Wound Description Classification: Grade 2 Wound Margin: Flat and Intact Exudate Amount: Medium Exudate Type: Serosanguineous Exudate Color: red, brown Foul Odor After Cleansing: No Slough/Fibrino Yes Wound Bed Granulation Amount: Small (1-33%) Exposed Structure Granulation Quality: Red Fascia Exposed: No Necrotic Amount: Large (67-100%) Fat Layer (Subcutaneous Tissue) Exposed: Yes Necrotic Quality: Eschar Tendon Exposed: No Muscle Exposed: No Joint Exposed: No Bone Exposed: No Treatment Notes Wound #4 (Right, Distal, Anterior Lower Leg) 1. Cleansed with: Clean wound with Normal Saline 2. Anesthetic Topical Lidocaine 4% cream to wound bed prior to debridement Hurricaine Topical Anesthetic Spray 4. Dressing Applied: [place], [place] ([id number]) Iodosorb Ointment 5. Secondary Dressing Applied ABD Pad 7. Secured with 3 Layer Compression System - Right Lower Extremity Electronic Signature(s) Signed: 10/04/2019 11:25:05 AM By: Army Melia Entered By: Army Melia on 10/04/2019 09:45:50 [place], [place] ([id number]) -------------------------------------------------------------------------------- Vitals Details Patient Name: [name] Date of Service: 10/04/2019 9:45 AM Medical Record Number: [id number] Patient Account Number: [id number] Date of Birth/Sex: 15-Aug-1970 (49 y.o. F) Treating RN: Army Melia Primary Care Kyce Ging: Freddy Finner Other Clinician: Referring Ariyon Mittleman: Referral, Self Treating Cadance Raus/Extender: Tito [place] in Treatment: 0 Vital Signs Time Taken: 09:34 Temperature (F): 98.6 Height (in): 60 Pulse (bpm): 93 Source: Stated Respiratory Rate (breaths/min): 16 Weight (lbs): 164 Blood Pressure (mmHg): 142/76 Source: Stated Reference Range: 80 - 120 mg / dl Body  Mass Index (BMI): 32 Electronic Signature(s) Signed: 10/04/2019 11:25:05 AM By: Army Melia Entered By: Army Melia on 10/04/2019 09:34:54

## 2019-10-04 NOTE — Progress Notes (Signed)
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 Maxwell Caul, MD. With the following instrument(s): Curette to remove Viable and Non-Viable tissue/material. Material removed includes Subcutaneous Tissue and Slough and after achieving pain control using Lidocaine. No specimens were taken. A time out was conducted at 10:00, prior to the start of the procedure. A Moderate amount of bleeding was controlled with Pressure. The procedure was tolerated well. Post Debridement Measurements: 0.5cm length x 1cm  width x 0.2cm depth; 0.079cm^3 volume. Character of Wound/Ulcer Post Debridement is stable. Severity of Tissue Post Debridement is: Fat layer exposed. 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 There was a Three Layer Compression Therapy Procedure with a pre-treatment ABI of 1.2 by Huel Coventry, RN. Post procedure Diagnosis Wound #3: Same as  Pre-Procedure Wound #4 Pre-procedure diagnosis of Wound #4 is a Diabetic Wound/Ulcer of the Lower Extremity located on the Right,Distal,Anterior Lower Leg .Severity of Tissue Pre Debridement is: Fat layer exposed. There was a Excisional Skin/Subcutaneous Tissue Debridement with a total area of 2.4 sq cm performed by Maxwell Caul, MD. With the following instrument(s): Curette to remove Viable and Non-Viable tissue/material. Material removed includes Subcutaneous Tissue and Slough and after achieving pain control using Lidocaine. No specimens were taken. A time out was conducted at 10:00, prior to the start of the procedure. A Moderate amount of bleeding was controlled with Pressure. The procedure was tolerated well. Post Debridement Measurements: 2cm length x 1.2cm width x 0.2cm depth; 0.377cm^3 volume. Character of Wound/Ulcer Post Debridement is stable. Severity of Tissue Post Debridement is: Fat layer exposed. Post procedure Diagnosis Wound #4: Same as Pre-Procedure Pre-procedure diagnosis of Wound #4 is a Diabetic Wound/Ulcer of the Lower Extremity located on the Right,Distal,Anterior Lower Leg . There was a Three Layer Compression Therapy Procedure with a pre-treatment ABI of 1.2 by Huel Coventry, RN. Post procedure Diagnosis Wound #4: Same as Pre-Procedure Plan Wound Cleansing: Wound #3 Right,Proximal,Anterior Lower Leg: Clean wound with Normal Saline. Cleanse wound with mild soap and water Wound #4 Right,Distal,Anterior Lower Leg: Clean wound with Normal Saline. Cleanse wound with mild soap and water Anesthetic (add to Medication List): Wound #3 Right,Proximal,Anterior Lower Leg: Topical Lidocaine 4% cream applied to wound bed prior to debridement (In Clinic Only). Benzocaine Topical Anesthetic Spray applied to wound bed prior to debridement (In Clinic Only). Wound #4 Right,Distal,Anterior Lower Leg: Topical Lidocaine 4% cream applied to wound bed prior to debridement (In Clinic  Only). Benzocaine Topical Anesthetic Spray applied to wound bed prior to debridement (In Clinic Only). Primary Wound Dressing: Wound #3 Right,Proximal,Anterior Lower Leg: Iodoflex Wound #4 Right,Distal,Anterior Lower Leg: Iodoflex Secondary Dressing: Wound #3 Right,Proximal,Anterior Lower Leg: ABD pad Wound #4 Right,Distal,Anterior Lower Leg: ABD pad Dressing Change Frequency: Wound #3 Right,Proximal,Anterior Lower Leg: Change dressing every week Wound #4 Right,Distal,Anterior Lower Leg: Change dressing every week Follow-up Appointments: Wound #3 Right,Proximal,Anterior Lower Leg: Return Appointment in 1 week. Wound #4 Right,Distal,Anterior Lower Leg: Return Appointment in 1 week. Edema Control: Wound #3 Right,Proximal,Anterior Lower Leg: 3 Layer Compression System - Left Lower Extremity Wound #4 Right,Distal,Anterior Lower Leg: 3 Layer Compression System - Left Lower Extremity [name], [name] ([id number]) 1. We will use Iodoflex under 3 layer compression 2. I think the patient does have some degree of chronic venous insufficiency 3. Iodoflex I hope we will keep buildup of the adherent necrotic surface to this. 4. The patient has poorly controlled diabetes although she states that her primary doctor recently increased her insulin and her fasting blood sugar today was 111 I spent 30 minutes in the review of this patient's record, face-to-face evaluation and preparation of this record Electronic Signature(s) Signed: 10/04/2019 5:25:14 PM By: Baltazar Najjar MD Entered By: Baltazar Najjar on 10/04/2019 10:37:03 [name], [name] ([id number]) -------------------------------------------------------------------------------- ROS/PFSH Details Patient Name: [name] Date of Service: 10/04/2019 9:45 AM Medical  Record Number: [id number] Patient Account Number: [id number] Date of Birth/Sex: 1971-05-10 (49 y.o. F) Treating RN: Rodell PernaScott, Dajea Primary Care Provider: Sandrea HughsUBIO, JESSICA Other  Clinician: Referring Provider: Referral, Self Treating Provider/Extender: Altamese CarolinaOBSON, Mateja Dier G Weeks in Treatment: 0 Information Obtained From Patient Constitutional Symptoms (General Health) Complaints and Symptoms: Negative for: Fatigue; Fever; Chills; Marked Weight Change Eyes Complaints and Symptoms: Negative for: Dry Eyes; Vision Changes; Glasses / Contacts Medical History: Negative for: Cataracts; Glaucoma; Optic Neuritis Ear/Nose/Mouth/Throat Complaints and Symptoms: Negative for: Difficult clearing ears; Sinusitis Medical History: Negative for: Chronic sinus problems/congestion; Middle ear problems Hematologic/Lymphatic Complaints and Symptoms: Negative for: Bleeding / Clotting Disorders; Human Immunodeficiency Virus Medical History: Negative for: Anemia; Hemophilia; Human Immunodeficiency Virus; Lymphedema; Sickle Cell Disease Respiratory Complaints and Symptoms: Negative for: Chronic or frequent coughs; Shortness of Breath Medical History: Negative for: Aspiration; Asthma; Chronic Obstructive Pulmonary Disease (COPD); Pneumothorax; Sleep Apnea; Tuberculosis Cardiovascular Complaints and Symptoms: Negative for: Chest pain; LE edema Medical History: Negative for: Angina; Arrhythmia; Congestive Heart Failure; Coronary Artery Disease; Deep Vein Thrombosis; Hypertension; Hypotension; Myocardial Infarction; Peripheral Arterial Disease; Peripheral Venous Disease; Phlebitis; Vasculitis Gastrointestinal Complaints and Symptoms: Negative for: Frequent diarrhea; Nausea; Vomiting Medical History: Negative for: Cirrhosis ; Colitis; Crohnos; Hepatitis A; Hepatitis B; Hepatitis C Endocrine Complaints and Symptoms: Negative for: Hepatitis; Thyroid disease; Polydypsia (Excessive Thirst) [name], [name] ([id number]) Medical History: Positive for: Type II Diabetes Negative for: Type I Diabetes Time with diabetes: 20 yrs started at pregnancy Treated with: Insulin, Oral agents Blood  sugar tested every day: Yes Tested : Genitourinary Complaints and Symptoms: Negative for: Kidney failure/ Dialysis; Incontinence/dribbling Medical History: Negative for: End Stage Renal Disease Immunological Complaints and Symptoms: Negative for: Hives; Itching Medical History: Negative for: Lupus Erythematosus; Raynaudos; Scleroderma Integumentary (Skin) Complaints and Symptoms: Positive for: Wounds Negative for: Bleeding or bruising tendency; Breakdown; Swelling Medical History: Negative for: History of Burn; History of pressure wounds Musculoskeletal Complaints and Symptoms: Negative for: Muscle Pain; Muscle Weakness Medical History: Negative for: Gout; Rheumatoid Arthritis; Osteoarthritis; Osteomyelitis Neurologic Complaints and Symptoms: Negative for: Numbness/parasthesias; Focal/Weakness Medical History: Negative for: Dementia; Neuropathy; Quadriplegia; Paraplegia; Seizure Disorder Psychiatric Complaints and Symptoms: Negative for: Anxiety; Claustrophobia Medical History: Negative for: Anorexia/bulimia; Confinement Anxiety Oncologic Medical History: Negative for: Received Chemotherapy; Received Radiation Immunizations Pneumococcal Vaccine: Received Pneumococcal Vaccination: No Implantable Devices None [name], [name] ([id number]) Family and Social History Cancer: No; Diabetes: Yes - Mother,Maternal Grandparents; Heart Disease: No; Hereditary Spherocytosis: No; Hypertension: Yes - Siblings; Kidney Disease: No; Lung Disease: No; Seizures: No; Stroke: No; Thyroid Problems: No; Tuberculosis: No; Never smoker; Marital Status - Single; Alcohol Use: Rarely; Drug Use: No History; Caffeine Use: Daily; Financial Concerns: No; Food, Clothing or Shelter Needs: No; Support System Lacking: No; Transportation Concerns: No Electronic Signature(s) Signed: 10/04/2019 11:25:05 AM By: Rodell PernaScott, Dajea Signed: 10/04/2019 5:25:14 PM By: Baltazar Najjarobson, Zakiyah Diop MD Entered By: Rodell PernaScott, Dajea on  10/04/2019 09:37:16 [name], Keyna ([id number]) -------------------------------------------------------------------------------- SuperBill Details Patient Name: [name] MassyGARCIA, Maripaz Date of Service: 10/04/2019 Medical Record Number: [id number] Patient Account Number: [id number] Date of Birth/Sex: 1971-05-10 (49 y.o. F) Treating RN: Huel CoventryWoody, Kim Primary Care Provider: Sandrea HughsUBIO, JESSICA Other Clinician: Referring Provider: Referral, Self Treating Provider/Extender: Altamese CarolinaOBSON, Jacobo Moncrief G Weeks in Treatment: 0 Diagnosis Coding ICD-10 Codes Code Description S80.811D Abrasion, right lower leg, subsequent encounter E11.622 Type 2 diabetes mellitus with other skin ulcer L97.812 Non-pressure chronic ulcer of other part of right lower leg with fat layer exposed Facility Procedures CPT4 Code: [id number] Description: 99214 - WOUND CARE VISIT-LEV 4 EST PT Modifier: Quantity: 1 CPT4 Code:  66060045 Description: 11042 - DEB SUBQ TISSUE 20 SQ CM/< Modifier: Quantity: 1 CPT4 Code: Description: ICD-10 Diagnosis Description S80.811D Abrasion, right lower leg, subsequent encounter 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

## 2019-10-11 ENCOUNTER — Encounter: Payer: 59 | Admitting: Internal Medicine

## 2019-10-11 ENCOUNTER — Other Ambulatory Visit: Payer: Self-pay

## 2019-10-11 DIAGNOSIS — E11621 Type 2 diabetes mellitus with foot ulcer: Secondary | ICD-10-CM | POA: Diagnosis not present

## 2019-10-11 DIAGNOSIS — E11622 Type 2 diabetes mellitus with other skin ulcer: Secondary | ICD-10-CM | POA: Diagnosis not present

## 2019-10-11 DIAGNOSIS — Z7984 Long term (current) use of oral hypoglycemic drugs: Secondary | ICD-10-CM | POA: Diagnosis not present

## 2019-10-11 DIAGNOSIS — L97812 Non-pressure chronic ulcer of other part of right lower leg with fat layer exposed: Secondary | ICD-10-CM | POA: Diagnosis not present

## 2019-10-11 DIAGNOSIS — S80811D Abrasion, right lower leg, subsequent encounter: Secondary | ICD-10-CM | POA: Diagnosis not present

## 2019-10-13 NOTE — Progress Notes (Signed)
[place], [name] ([id number]) Visit Report for 10/11/2019 Arrival Information Details Patient Name: JAMIESON, LISA Date of Service: 10/11/2019 12:30 PM Medical Record Number: [id number] Patient Account Number: [id number] Date of Birth/Sex: 12/22/1970 (49 y.o. F) Treating RN: Huel Coventry Primary Care Moksh Loomer: Sandrea Hughs Other Clinician: Referring Ikeem Cleckler: Sandrea Hughs Treating Kaycee Mcgaugh/Extender: Altamese [place] in Treatment: 1 Visit Information History Since Last Visit Added or deleted any medications: No Patient Arrived: Ambulatory Any new allergies or adverse reactions: No Arrival Time: 12:35 Had a fall or experienced change in No Accompanied By: self activities of daily living that may affect Transfer Assistance: None risk of falls: Patient Identification Verified: Yes Signs or symptoms of abuse/neglect since last visito No Secondary Verification Process Completed: Yes Hospitalized since last visit: No Implantable device outside of the clinic excluding No cellular tissue based products placed in the center since last visit: Has Dressing in Place as Prescribed: Yes Has Compression in Place as Prescribed: Yes Pain Present Now: No Electronic Signature(s) Signed: 10/11/2019 4:00:59 PM By: Dayton Martes RCP, RRT, CHT Entered By: Dayton Martes on 10/11/2019 12:36:49 [place], Carinna ([id number]) -------------------------------------------------------------------------------- Encounter Discharge Information Details Patient Name: [name] Date of Service: 10/11/2019 12:30 PM Medical Record Number: [id number] Patient Account Number: [id number] Date of Birth/Sex: 07-Jun-1971 (49 y.o. F) Treating RN: Huel Coventry Primary Care Taylor Levick: Sandrea Hughs Other Clinician: Referring Scottie Metayer: Sandrea Hughs Treating Zlata Alcaide/Extender: Altamese [place] in Treatment: 1 Encounter Discharge Information Items Discharge Condition: Stable Ambulatory  Status: Ambulatory Discharge Destination: Home Transportation: Private Auto Accompanied By: self Schedule Follow-up Appointment: Yes Clinical Summary of Care: Electronic Signature(s) Signed: 10/13/2019 5:21:44 PM By: Elliot Gurney, BSN, RN, CWS, Kim RN, BSN Entered By: Elliot Gurney, BSN, RN, CWS, Kim on 10/11/2019 12:54:44 [place], Cyntha ([id number]) -------------------------------------------------------------------------------- Lower Extremity Assessment Details Patient Name: [name] Date of Service: 10/11/2019 12:30 PM Medical Record Number: [id number] Patient Account Number: [id number] Date of Birth/Sex: 1971/07/12 (48 y.o. F) Treating RN: Curtis Sites Primary Care Dorthy Magnussen: Sandrea Hughs Other Clinician: Referring Zabella Wease: Sandrea Hughs Treating Diantha Paxson/Extender: Maxwell Caul Weeks in Treatment: 1 Edema Assessment Assessed: [Left: No] [Right: No] Edema: [Left: Ye] [Right: s] Calf Left: Right: Point of Measurement: 27 cm From Medial Instep cm 35.5 cm Ankle Left: Right: Point of Measurement: 9 cm From Medial Instep cm 22 cm Vascular Assessment Pulses: Dorsalis Pedis Palpable: [Right:Yes] Electronic Signature(s) Signed: 10/11/2019 4:39:55 PM By: Curtis Sites Entered By: Curtis Sites on 10/11/2019 12:40:56 Sindelar, Kadiatou ([id number]) -------------------------------------------------------------------------------- Multi Wound Chart Details Patient Name: [name] Date of Service: 10/11/2019 12:30 PM Medical Record Number: [id number] Patient Account Number: [id number] Date of Birth/Sex: March 30, 1971 (48 y.o. F) Treating RN: Huel Coventry Primary Care Sherlynn Tourville: Sandrea Hughs Other Clinician: Referring Judd Mccubbin: Sandrea Hughs Treating Allegra Cerniglia/Extender: Altamese [place] in Treatment: 1 Vital Signs Height(in): 60 Pulse(bpm): 102 Weight(lbs): 164 Blood Pressure(mmHg): 132/73 Body Mass Index(BMI): 32 Temperature(F): 99.0 Respiratory Rate(breaths/min): 16 Photos:  [N/A:N/A] Wound Location: Right Lower Leg - Anterior, Proximal Right Lower Leg - Anterior, Distal N/A Wounding Event: Trauma Trauma N/A Primary Etiology: Diabetic Wound/Ulcer of the Lower Diabetic Wound/Ulcer of the Lower N/A Extremity Extremity Comorbid History: Type II Diabetes Type II Diabetes N/A Date Acquired: 09/05/2019 09/05/2019 N/A Weeks of Treatment: 1 1 N/A Wound Status: Open Open N/A Measurements L x W x D (cm) 0.5x0.5x0.1 1.5x1x0.1 N/A Area (cm) : 0.196 1.178 N/A Volume (cm) : 0.02 0.118 N/A % Reduction in Area: 50.10% 37.50% N/A % Reduction in Volume: 48.70% 37.20% N/A Classification: Grade 2 Grade 2 N/A  Exudate Amount: Medium Medium N/A Exudate Type: Serosanguineous Serosanguineous N/A Exudate Color: red, brown red, brown N/A Wound Margin: Flat and Intact Flat and Intact N/A Granulation Amount: Large (67-100%) Large (67-100%) N/A Granulation Quality: Red Red N/A Necrotic Amount: Small (1-33%) Small (1-33%) N/A Necrotic Tissue: Eschar Eschar, Adherent Slough N/A Exposed Structures: Fat Layer (Subcutaneous Tissue) Fat Layer (Subcutaneous Tissue) N/A Exposed: Yes Exposed: Yes Fascia: No Fascia: No Tendon: No Tendon: No Muscle: No Muscle: No Joint: No Joint: No Bone: No Bone: No Epithelialization: Small (1-33%) Small (1-33%) N/A Treatment Notes Wound #3 (Right, Proximal, Anterior Lower Leg) 1. Cleansed with: Clean wound with Normal Saline 2. Anesthetic Topical Lidocaine 4% cream to wound bed prior to debridement Younts, Donnelle ([id number]) Hurricaine Topical Anesthetic Spray 4. Dressing Applied: Iodosorb Ointment 5. Secondary Dressing Applied ABD Pad 7. Secured with 3 Layer Compression System - Right Lower Extremity Wound #4 (Right, Distal, Anterior Lower Leg) 1. Cleansed with: Clean wound with Normal Saline 2. Anesthetic Topical Lidocaine 4% cream to wound bed prior to debridement Hurricaine Topical Anesthetic Spray 4. Dressing Applied: Iodosorb  Ointment 5. Secondary Dressing Applied ABD Pad 7. Secured with 3 Layer Compression System - Right Lower Extremity Electronic Signature(s) Signed: 10/11/2019 5:07:28 PM By: Baltazar Najjar MD Entered By: Baltazar Najjar on 10/11/2019 12:56:00 Ramp, Ebbie ([id number]) -------------------------------------------------------------------------------- Multi-Disciplinary Care Plan Details Patient Name: [name] Date of Service: 10/11/2019 12:30 PM Medical Record Number: [id number] Patient Account Number: [id number] Date of Birth/Sex: [birth year] (48 y.o. F) Treating RN: Huel Coventry Primary Care Emelio Schneller: Sandrea Hughs Other Clinician: Referring [place] Weekly: Sandrea Hughs Treating Oliviah Agostini/Extender: Altamese [place] in Treatment: 1 Active Inactive Orientation to the Wound Care Program Nursing Diagnoses: Knowledge deficit related to the wound healing center program Goals: Patient/caregiver will verbalize understanding of the Wound Healing Center Program Date Initiated: 10/04/2019 Target Resolution Date: 10/11/2019 Goal Status: Active Interventions: Provide education on orientation to the wound center Notes: Wound/Skin Impairment Nursing Diagnoses: Impaired tissue integrity Knowledge deficit related to ulceration/compromised skin integrity Goals: Ulcer/skin breakdown will have a volume reduction of 30% by week 4 Date Initiated: 10/04/2019 Target Resolution Date: 11/04/2019 Goal Status: Active Interventions: Assess patient/caregiver ability to obtain necessary supplies Assess ulceration(s) every visit Treatment Activities: Skin care regimen initiated : 10/04/2019 Topical wound management initiated : 10/04/2019 Notes: Electronic Signature(s) Signed: 10/13/2019 5:21:44 PM By: Elliot Gurney, BSN, RN, CWS, Kim RN, BSN Entered By: Elliot Gurney, BSN, RN, CWS, Kim on 10/11/2019 12:51:08 [place], [place] ([id number]) -------------------------------------------------------------------------------- Pain  Assessment Details Patient Name: [name] Date of Service: 10/11/2019 12:30 PM Medical Record Number: [id number] Patient Account Number: [id number] Date of Birth/Sex: April 09, 1971 (48 y.o. F) Treating RN: Curtis Sites Primary Care Leone Mobley: Sandrea Hughs Other Clinician: Referring Courtnie Brenes: Sandrea Hughs Treating Jazzlin Clements/Extender: Altamese [place] in Treatment: 1 Active Problems Location of Pain Severity and Description of Pain Patient Has Paino Yes Site Locations Pain Location: Pain in Ulcers Duration of the Pain. Constant / Intermittento Intermittent Character of Pain Describe the Pain: Stabbing Pain Management and Medication Current Pain Management: Electronic Signature(s) Signed: 10/11/2019 4:39:55 PM By: Curtis Sites Entered By: Curtis Sites on 10/11/2019 12:40:01 Graw, Dariah ([id number]) -------------------------------------------------------------------------------- Patient/Caregiver Education Details Patient Name: [name] Date of Service: 10/11/2019 12:30 PM Medical Record Number: [id number] Patient Account Number: [id number] Date of Birth/Gender: 12/29/1970 (49 y.o. F) Treating RN: Huel Coventry Primary Care Physician: Sandrea Hughs Other Clinician: Referring Physician: Sandrea Hughs Treating Physician/Extender: Altamese [place] in Treatment: 1 Education Assessment Education Provided To: Patient Education Topics Provided Wound/Skin Impairment:  Handouts: Caring for Your Ulcer Methods: Demonstration, Explain/Verbal Responses: State content correctly Electronic Signature(s) Signed: 10/13/2019 5:21:44 PM By: Gretta Cool, BSN, RN, CWS, Kim RN, BSN Entered By: Gretta Cool, BSN, RN, CWS, Kim on 10/11/2019 12:54:06 [place], [place] ([id number]) -------------------------------------------------------------------------------- Wound Assessment Details Patient Name: Mickey Farber Date of Service: 10/11/2019 12:30 PM Medical Record Number: [id number] Patient  Account Number: [id number] Date of Birth/Sex: 24-Oct-1970 (49 y.o. F) Treating RN: Montey Hora Primary Care Khira Cudmore: Freddy Finner Other Clinician: Referring Malaika Arnall: Freddy Finner Treating Jann Ra/Extender: Ricard Dillon Weeks in Treatment: 1 Wound Status Wound Number: 3 Primary Etiology: Diabetic Wound/Ulcer of the Lower Extremity Wound Location: Right Lower Leg - Anterior, Proximal Wound Status: Open Wounding Event: Trauma Comorbid History: Type II Diabetes Date Acquired: 09/05/2019 Weeks Of Treatment: 1 Clustered Wound: No Photos Wound Measurements Length: (cm) 0.5 Width: (cm) 0.5 Depth: (cm) 0.1 Area: (cm) 0.196 Volume: (cm) 0.02 % Reduction in Area: 50.1% % Reduction in Volume: 48.7% Epithelialization: Small (1-33%) Tunneling: No Undermining: No Wound Description Classification: Grade 2 Wound Margin: Flat and Intact Exudate Amount: Medium Exudate Type: Serosanguineous Exudate Color: red, brown Foul Odor After Cleansing: No Slough/Fibrino Yes Wound Bed Granulation Amount: Large (67-100%) Exposed Structure Granulation Quality: Red Fascia Exposed: No Necrotic Amount: Small (1-33%) Fat Layer (Subcutaneous Tissue) Exposed: Yes Necrotic Quality: Eschar Tendon Exposed: No Muscle Exposed: No Joint Exposed: No Bone Exposed: No Treatment Notes Wound #3 (Right, Proximal, Anterior Lower Leg) 1. Cleansed with: Clean wound with Normal Saline 2. Anesthetic Topical Lidocaine 4% cream to wound bed prior to debridement Hurricaine Topical Anesthetic Spray 4. Dressing Applied: [place], Sham ([id number]) Iodosorb Ointment 5. Secondary Dressing Applied ABD Pad 7. Secured with 3 Layer Compression System - Right Lower Extremity Electronic Signature(s) Signed: 10/11/2019 4:39:55 PM By: Montey Hora Entered By: Montey Hora on 10/11/2019 12:47:22 [place], [place] ([id number]) -------------------------------------------------------------------------------- Wound  Assessment Details Patient Name: Mickey Farber Date of Service: 10/11/2019 12:30 PM Medical Record Number: [id number] Patient Account Number: [id number] Date of Birth/Sex: 01/02/1971 (50 y.o. F) Treating RN: Montey Hora Primary Care Jaimy Kliethermes: Freddy Finner Other Clinician: Referring Krupa Stege: Freddy Finner Treating Taimane Stimmel/Extender: Ricard Dillon Weeks in Treatment: 1 Wound Status Wound Number: 4 Primary Etiology: Diabetic Wound/Ulcer of the Lower Extremity Wound Location: Right Lower Leg - Anterior, Distal Wound Status: Open Wounding Event: Trauma Comorbid History: Type II Diabetes Date Acquired: 09/05/2019 Weeks Of Treatment: 1 Clustered Wound: No Photos Wound Measurements Length: (cm) 1.5 Width: (cm) 1 Depth: (cm) 0.1 Area: (cm) 1.178 Volume: (cm) 0.118 % Reduction in Area: 37.5% % Reduction in Volume: 37.2% Epithelialization: Small (1-33%) Tunneling: No Undermining: No Wound Description Classification: Grade 2 Wound Margin: Flat and Intact Exudate Amount: Medium Exudate Type: Serosanguineous Exudate Color: red, brown Foul Odor After Cleansing: No Slough/Fibrino Yes Wound Bed Granulation Amount: Large (67-100%) Exposed Structure Granulation Quality: Red Fascia Exposed: No Necrotic Amount: Small (1-33%) Fat Layer (Subcutaneous Tissue) Exposed: Yes Necrotic Quality: Eschar, Adherent Slough Tendon Exposed: No Muscle Exposed: No Joint Exposed: No Bone Exposed: No Treatment Notes Wound #4 (Right, Distal, Anterior Lower Leg) 1. Cleansed with: Clean wound with Normal Saline 2. Anesthetic Topical Lidocaine 4% cream to wound bed prior to debridement Hurricaine Topical Anesthetic Spray 4. Dressing Applied: [place], Maresha ([id number]) Iodosorb Ointment 5. Secondary Dressing Applied ABD Pad 7. Secured with 3 Layer Compression System - Right Lower Extremity Electronic Signature(s) Signed: 10/11/2019 4:39:55 PM By: Montey Hora Entered By: Montey Hora  on 10/11/2019 12:47:54 [place], [place] ([id number]) -------------------------------------------------------------------------------- Vitals Details Patient Name: Mickey Farber Date of Service:  10/11/2019 12:30 PM Medical Record Number: [id number] Patient Account Number: [id number] Date of Birth/Sex: Dec 24, 1970 (49 y.o. F) Treating RN: Huel Coventry Primary Care Shaye Elling: Sandrea Hughs Other Clinician: Referring Tamsyn Owusu: Sandrea Hughs Treating Necola Bluestein/Extender: Altamese [place] in Treatment: 1 Vital Signs Time Taken: 12:32 Temperature (F): 99.0 Height (in): 60 Pulse (bpm): 102 Weight (lbs): 164 Respiratory Rate (breaths/min): 16 Body Mass Index (BMI): 32 Blood Pressure (mmHg): 132/73 Reference Range: 80 - 120 mg / dl Electronic Signature(s) Signed: 10/11/2019 4:00:59 PM By: Dayton Martes RCP, RRT, CHT Entered By: Dayton Martes on 10/11/2019 12:37:16
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
## 2019-10-18 ENCOUNTER — Other Ambulatory Visit: Payer: Self-pay

## 2019-10-18 ENCOUNTER — Encounter: Payer: 59 | Admitting: Internal Medicine

## 2019-10-18 DIAGNOSIS — Z7984 Long term (current) use of oral hypoglycemic drugs: Secondary | ICD-10-CM | POA: Diagnosis not present

## 2019-10-18 DIAGNOSIS — L97812 Non-pressure chronic ulcer of other part of right lower leg with fat layer exposed: Secondary | ICD-10-CM | POA: Diagnosis not present

## 2019-10-18 DIAGNOSIS — S80811D Abrasion, right lower leg, subsequent encounter: Secondary | ICD-10-CM | POA: Diagnosis not present

## 2019-10-18 DIAGNOSIS — E11621 Type 2 diabetes mellitus with foot ulcer: Secondary | ICD-10-CM | POA: Diagnosis not present

## 2019-10-18 DIAGNOSIS — E11622 Type 2 diabetes mellitus with other skin ulcer: Secondary | ICD-10-CM | POA: Diagnosis not present
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
## 2019-10-26 ENCOUNTER — Encounter: Payer: 59 | Attending: Physician Assistant | Admitting: Physician Assistant

## 2019-10-26 ENCOUNTER — Other Ambulatory Visit: Payer: Self-pay

## 2019-10-26 DIAGNOSIS — E11622 Type 2 diabetes mellitus with other skin ulcer: Secondary | ICD-10-CM | POA: Diagnosis not present

## 2019-10-26 DIAGNOSIS — L97812 Non-pressure chronic ulcer of other part of right lower leg with fat layer exposed: Secondary | ICD-10-CM | POA: Insufficient documentation

## 2019-10-26 DIAGNOSIS — Z09 Encounter for follow-up examination after completed treatment for conditions other than malignant neoplasm: Secondary | ICD-10-CM | POA: Insufficient documentation
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
## 2019-10-26 NOTE — Progress Notes (Signed)
[place], [name] ([id number]) Visit Report for 10/26/2019 Arrival Information Details Patient Name: [name], [name] Date of Service: 10/26/2019 12:30 PM Medical Record Number: [id number] Patient Account Number: [id number] Date of Birth/Sex: Jun 29, 1971 (49 y.o. F) Treating RN: Curtis Sites Primary Care Kason Benak: Sandrea Hughs Other Clinician: Referring Yacine Droz: Sandrea Hughs Treating Beauregard Jarrells/Extender: Linwood Dibbles, HOYT Weeks in Treatment: 3 Visit Information History Since Last Visit Added or deleted any medications: No Patient Arrived: Ambulatory Any new allergies or adverse reactions: No Arrival Time: 12:41 Had a fall or experienced change in No Accompanied By: self activities of daily living that may affect Transfer Assistance: None risk of falls: Patient Identification Verified: Yes Signs or symptoms of abuse/neglect since last visito No Secondary Verification Process Completed: Yes Hospitalized since last visit: No Implantable device outside of the clinic excluding No cellular tissue based products placed in the center since last visit: Has Dressing in Place as Prescribed: Yes Has Compression in Place as Prescribed: Yes Pain Present Now: No Electronic Signature(s) Signed: 10/26/2019 4:50:17 PM By: Curtis Sites Entered By: Curtis Sites on 10/26/2019 12:42:07 Alwin, Arisha ([id number]) -------------------------------------------------------------------------------- Clinic Level of Care Assessment Details Patient Name: [name] Date of Service: 10/26/2019 12:30 PM Medical Record Number: [id number] Patient Account Number: [id number] Date of Birth/Sex: [birth year] (49 y.o. F) Treating RN: Rodell Perna Primary Care Christyl Osentoski: Sandrea Hughs Other Clinician: Referring Aryella Besecker: Sandrea Hughs Treating Ronniesha Seibold/Extender: Linwood Dibbles, HOYT Weeks in Treatment: 3 Clinic Level of Care Assessment Items TOOL 4 Quantity Score []  - Use when only an EandM is performed on FOLLOW-UP visit  0 ASSESSMENTS - Nursing Assessment / Reassessment X - Reassessment of Co-morbidities (includes updates in patient status) 1 10 X- 1 5 Reassessment of Adherence to Treatment Plan ASSESSMENTS - Wound and Skin Assessment / Reassessment []  - Simple Wound Assessment / Reassessment - one wound 0 X- 2 5 Complex Wound Assessment / Reassessment - multiple wounds []  - 0 Dermatologic / Skin Assessment (not related to wound area) ASSESSMENTS - Focused Assessment []  - Circumferential Edema Measurements - multi extremities 0 []  - 0 Nutritional Assessment / Counseling / Intervention []  - 0 Lower Extremity Assessment (monofilament, tuning fork, pulses) []  - 0 Peripheral Arterial Disease Assessment (using hand held doppler) ASSESSMENTS - Ostomy and/or Continence Assessment and Care []  - Incontinence Assessment and Management 0 []  - 0 Ostomy Care Assessment and Management (repouching, etc.) PROCESS - Coordination of Care X - Simple Patient / Family Education for ongoing care 1 15 []  - 0 Complex (extensive) Patient / Family Education for ongoing care X- 1 10 Staff obtains , Records, Test Results / Process Orders []  - 0 Staff telephones HHA, Nursing Homes / Clarify orders / etc []  - 0 Routine Transfer to another Facility (non-emergent condition) []  - 0 Routine Hospital Admission (non-emergent condition) []  - 0 New Admissions / / Ordering NPWT, Apligraf, etc. []  - 0 Emergency Hospital Admission (emergent condition) X- 1 10 Simple Discharge Coordination []  - 0 Complex (extensive) Discharge Coordination PROCESS - Special Needs []  - Pediatric / Minor Patient Management 0 []  - 0 Isolation Patient Management []  - 0 Hearing / Language / Visual special needs []  - 0 Assessment of Community assistance (transportation, D/C planning, etc.) Knights, [place] ( ) []  - 0 Additional assistance / Altered mentation []  - 0 Support Surface(s) Assessment (bed,  cushion, seat, etc.) INTERVENTIONS - Wound Cleansing / Measurement []  - Simple Wound Cleansing - one wound 0 X- 2 5 Complex Wound Cleansing - multiple wounds X- 1 5 Wound Imaging (  photographs - any number of wounds) []  - 0 Wound Tracing (instead of photographs) []  - 0 Simple Wound Measurement - one wound X- 2 5 Complex Wound Measurement - multiple wounds INTERVENTIONS - Wound Dressings []  - Small Wound Dressing one or multiple wounds 0 []  - 0 Medium Wound Dressing one or multiple wounds []  - 0 Large Wound Dressing one or multiple wounds []  - 0 Application of Medications - topical []  - 0 Application of Medications - injection INTERVENTIONS - Miscellaneous []  - External ear exam 0 []  - 0 Specimen Collection (cultures, biopsies, blood, body fluids, etc.) []  - 0 Specimen(s) / Culture(s) sent or taken to Lab for analysis []  - 0 Patient Transfer (multiple staff / / Similar devices) []  - 0 Simple Staple / Suture removal (25 or less) []  - 0 Complex Staple / Suture removal (26 or more) []  - 0 Hypo / Hyperglycemic Management (close monitor of Blood Glucose) []  - 0 Ankle / Brachial Index (ABI) - do not check if billed separately X- 1 5 Vital Signs Has the patient been seen at the hospital within the last three years: Yes Total Score: 90 Level Of Care: New/Established - Level 3 Electronic Signature(s) Signed: 10/26/2019 3:58:38 PM By: Entered By: on 10/26/2019 12:59:35 Dado, Talyn ( ) -------------------------------------------------------------------------------- Encounter Discharge Information Details Patient Name: Date of Service: 10/26/2019 12:30 PM Medical Record Number: Patient Account Number: Date of Birth/Sex: Oct 10, 1970 (49 y.o. F) Treating RN: Primary Care Ezma Rehm: Other Clinician: Referring Ziomara Birenbaum: Treating Jenise Iannelli/Extender: ,  HOYT Weeks in Treatment: 3 Encounter Discharge Information Items Discharge Condition: Stable Ambulatory Status: Ambulatory Discharge Destination: Home Transportation: Private Auto Accompanied By: self Schedule Follow-up Appointment: Yes Clinical Summary of Care: Electronic Signature(s) Signed: 10/26/2019 3:58:38 PM By: Rodell Perna Entered By: Rodell Perna on 10/26/2019 13:00:07 Ribaudo, Tamya ([id number]) -------------------------------------------------------------------------------- Lower Extremity Assessment Details Patient Name: [name] Date of Service: 10/26/2019 12:30 PM Medical Record Number: [id number] Patient Account Number: [id number] Date of Birth/Sex: [birth year] (49 y.o. F) Treating RN: Rodell Perna Primary Care Lataysha Vohra: Sandrea Hughs Other Clinician: Referring Damesha Lawler: Sandrea Hughs Treating Carnisha Feltz/Extender: STONE III, HOYT Weeks in Treatment: 3 Edema Assessment Assessed: [Left: No] [Right: No] Edema: [Left: N] [Right: o] Calf Left: Right: Point of Measurement: 27 cm From Medial Instep cm 34 cm Ankle Left: Right: Point of Measurement: 9 cm From Medial Instep cm 21 cm Vascular Assessment Pulses: Dorsalis Pedis Palpable: [Right:Yes] Electronic Signature(s) Signed: 10/26/2019 4:50:17 PM By: 12/26/2019 Entered By: Rodell Perna on 10/26/2019 12:47:15 Doe, Ahonesty (12/26/2019) -------------------------------------------------------------------------------- Multi Wound Chart Details Patient Name: [id number] Date of Service: 10/26/2019 12:30 PM Medical Record Number: 12/26/2019 Patient Account Number: [id number] Date of Birth/Sex: July 28, 1970 (49 y.o. F) Treating RN: 52 Primary Care Onur Mori: Curtis Sites Other Clinician: Referring Nakaiya Beddow: Sandrea Hughs Treating Lonzy Mato/Extender: STONE III, HOYT Weeks in Treatment: 3 Vital Signs Height(in): 60 Pulse(bpm): 101 Weight(lbs): 164 Blood Pressure(mmHg): 130/78 Body Mass Index(BMI):  32 Temperature(F): 99.1 Respiratory Rate(breaths/min): 16 Photos: [N/A:N/A] Wound Location: Right, Proximal, Anterior Lower Leg Right, Distal, Anterior Lower Leg N/A Wounding Event: Trauma Trauma N/A Primary Etiology: Diabetic Wound/Ulcer of the Lower Diabetic Wound/Ulcer of the Lower N/A Extremity Extremity Comorbid History: Type II Diabetes Type II Diabetes N/A Date Acquired: 09/05/2019 09/05/2019 N/A Weeks of Treatment: 3 3 N/A Wound Status: Healed - Epithelialized Healed - Epithelialized N/A Measurements L x W x D (cm) 0x0x0 0x0x0 N/A Area (cm) : 0 0  N/A Volume (cm) : 0 0 N/A % Reduction in Area: 100.00% 100.00% N/A % Reduction in Volume: 100.00% 100.00% N/A Classification: Grade 2 Grade 2 N/A Exudate Amount: None Present None Present N/A Wound Margin: Flat and Intact Flat and Intact N/A Granulation Amount: None Present (0%) None Present (0%) N/A Necrotic Amount: Large (67-100%) Large (67-100%) N/A Necrotic Tissue: Eschar Eschar N/A Exposed Structures: Fat Layer (Subcutaneous Tissue) Fat Layer (Subcutaneous Tissue) N/A Exposed: Yes Exposed: Yes Fascia: No Fascia: No Tendon: No Tendon: No Muscle: No Muscle: No Joint: No Joint: No Bone: No Bone: No Epithelialization: Large (67-100%) Large (67-100%) N/A Treatment Notes Electronic Signature(s) Signed: 10/26/2019 3:58:38 PM By: Army Melia Entered By: Army Melia on 10/26/2019 12:58:32 Bonham, [place] ([id number]) -------------------------------------------------------------------------------- Multi-Disciplinary Care Plan Details Patient Name: Mickey Farber Date of Service: 10/26/2019 12:30 PM Medical Record Number: [id number] Patient Account Number: [id number] Date of Birth/Sex: 1970/10/28 (49 y.o. F) Treating RN: Army Melia Primary Care Shandy Vi: Freddy Finner Other Clinician: Referring Tedric Leeth: Freddy Finner Treating Latica Hohmann/Extender: Melburn Hake, HOYT Weeks in Treatment: 3 Active Inactive Electronic  Signature(s) Signed: 10/26/2019 3:58:38 PM By: Army Melia Entered By: Army Melia on 10/26/2019 12:58:25 Harris, [place] ([id number]) -------------------------------------------------------------------------------- Pain Assessment Details Patient Name: Mickey Farber Date of Service: 10/26/2019 12:30 PM Medical Record Number: [id number] Patient Account Number: [id number] Date of Birth/Sex: 1971-04-02 (49 y.o. F) Treating RN: Montey Hora Primary Care Jhordyn Hoopingarner: Freddy Finner Other Clinician: Referring Analaya Hoey: Freddy Finner Treating Adamarie Izzo/Extender: Melburn Hake, HOYT Weeks in Treatment: 3 Active Problems Location of Pain Severity and Description of Pain Patient Has Paino No Site Locations Pain Management and Medication Current Pain Management: Electronic Signature(s) Signed: 10/26/2019 4:50:17 PM By: Montey Hora Entered By: Montey Hora on 10/26/2019 12:43:17 [place], [place] ([id number]) -------------------------------------------------------------------------------- Patient/Caregiver Education Details Patient Name: Mickey Farber Date of Service: 10/26/2019 12:30 PM Medical Record Number: [id number] Patient Account Number: [id number] Date of Birth/Gender: May 16, 1971 (49 y.o. F) Treating RN: Army Melia Primary Care Physician: Freddy Finner Other Clinician: Referring Physician: Freddy Finner Treating Physician/Extender: Sharalyn Ink in Treatment: 3 Education Assessment Education Provided To: Patient Education Topics Provided Wound/Skin Impairment: Handouts: Caring for Your Ulcer Methods: Demonstration, Explain/Verbal Responses: State content correctly Electronic Signature(s) Signed: 10/26/2019 3:58:38 PM By: Army Melia Entered By: Army Melia on 10/26/2019 12:59:52 Engelhard, [place] ([id number]) -------------------------------------------------------------------------------- Wound Assessment Details Patient Name: Mickey Farber Date of Service: 10/26/2019 12:30 PM Medical  Record Number: [id number] Patient Account Number: [id number] Date of Birth/Sex: [birth year] (49 y.o. F) Treating RN: Army Melia Primary Care Berry Gallacher: Freddy Finner Other Clinician: Referring Shey Bartmess: Freddy Finner Treating Ricco Dershem/Extender: STONE III, HOYT Weeks in Treatment: 3 Wound Status Wound Number: 3 Primary Etiology: Diabetic Wound/Ulcer of the Lower Extremity Wound Location: Right, Proximal, Anterior Lower Leg Wound Status: Healed - Epithelialized Wounding Event: Trauma Comorbid History: Type II Diabetes Date Acquired: 09/05/2019 Weeks Of Treatment: 3 Clustered Wound: No Photos Wound Measurements Length: (cm) Width: (cm) Depth: (cm) Area: (cm) Volume: (cm) 0 % Reduction in Area: 100% 0 % Reduction in Volume: 100% 0 Epithelialization: Large (67-100%) 0 Tunneling: No 0 Undermining: No Wound Description Classification: Grade 2 Wound Margin: Flat and Intact Exudate Amount: None Present Foul Odor After Cleansing: No Slough/Fibrino No Wound Bed Granulation Amount: None Present (0%) Exposed Structure Necrotic Amount: Large (67-100%) Fascia Exposed: No Necrotic Quality: Eschar Fat Layer (Subcutaneous Tissue) Exposed: Yes Tendon Exposed: No Muscle Exposed: No Joint Exposed: No Bone Exposed: No Electronic Signature(s) Signed: 10/26/2019 3:58:38 PM By: Army Melia Entered By: Army Melia on 10/26/2019 12:58:07 Jagodzinski, [place] (  [id number]) -------------------------------------------------------------------------------- Wound Assessment Details Patient Name: SHAYLIE, EKLUND Date of Service: 10/26/2019 12:30 PM Medical Record Number: [id number] Patient Account Number: [id number] Date of Birth/Sex: Sep 08, 1970 (49 y.o. F) Treating RN: Rodell Perna Primary Care Alle Difabio: Sandrea Hughs Other Clinician: Referring Isauro Skelley: Sandrea Hughs Treating Maimouna Rondeau/Extender: STONE III, HOYT Weeks in Treatment: 3 Wound Status Wound Number: 4 Primary Etiology: Diabetic Wound/Ulcer  of the Lower Extremity Wound Location: Right, Distal, Anterior Lower Leg Wound Status: Healed - Epithelialized Wounding Event: Trauma Comorbid History: Type II Diabetes Date Acquired: 09/05/2019 Weeks Of Treatment: 3 Clustered Wound: No Photos Wound Measurements Length: (cm) Width: (cm) Depth: (cm) Area: (cm) Volume: (cm) 0 % Reduction in Area: 100% 0 % Reduction in Volume: 100% 0 Epithelialization: Large (67-100%) 0 Tunneling: No 0 Undermining: No Wound Description Classification: Grade 2 Wound Margin: Flat and Intact Exudate Amount: None Present Foul Odor After Cleansing: No Slough/Fibrino No Wound Bed Granulation Amount: None Present (0%) Exposed Structure Necrotic Amount: Large (67-100%) Fascia Exposed: No Necrotic Quality: Eschar Fat Layer (Subcutaneous Tissue) Exposed: Yes Tendon Exposed: No Muscle Exposed: No Joint Exposed: No Bone Exposed: No Electronic Signature(s) Signed: 10/26/2019 3:58:38 PM By: Rodell Perna Entered By: Rodell Perna on 10/26/2019 12:58:16 Benally, Arliene ([id number]) -------------------------------------------------------------------------------- Vitals Details Patient Name: [name] Date of Service: 10/26/2019 12:30 PM Medical Record Number: [id number] Patient Account Number: [id number] Date of Birth/Sex: [birth year] (49 y.o. F) Treating RN: Curtis Sites Primary Care Merranda Bolls: Sandrea Hughs Other Clinician: Referring Lysle Yero: Sandrea Hughs Treating Norissa Bartee/Extender: STONE III, HOYT Weeks in Treatment: 3 Vital Signs Time Taken: 12:42 Temperature (F): 99.1 Height (in): 60 Pulse (bpm): 101 Weight (lbs): 164 Respiratory Rate (breaths/min): 16 Body Mass Index (BMI): 32 Blood Pressure (mmHg): 130/78 Reference Range: 80 - 120 mg / dl Electronic Signature(s) Signed: 10/26/2019 4:50:17 PM By: Curtis Sites Entered By: Curtis Sites on 10/26/2019 12:43:10

## 2019-11-08 NOTE — Progress Notes (Signed)
[name], [name] ([id number]) Visit Report for 10/18/2019 Arrival Information Details Patient Name: [name], [name] Date of Service: 10/18/2019 1:00 PM Medical Record Number: [id number] Patient Account Number: [id number] Date of Birth/Sex: [birth year] (49 y.o. F) Treating RN: Rodell Perna Primary Care Gelena Klosinski: Sandrea Hughs Other Clinician: Referring Dorthie Santini: Sandrea Hughs Treating Zuma Hust/Extender: Altamese [place] in Treatment: 2 Visit Information History Since Last Visit Added or deleted any medications: No Patient Arrived: Ambulatory Any new allergies or adverse reactions: No Arrival Time: 13:02 Had a fall or experienced change in No Accompanied By: self activities of daily living that may affect Transfer Assistance: None risk of falls: Patient Identification Verified: Yes Signs or symptoms of abuse/neglect since last visito No Hospitalized since last visit: No Has Dressing in Place as Prescribed: Yes Pain Present Now: No Electronic Signature(s) Signed: 10/19/2019 3:40:54 PM By: Rodell Perna Entered By: Rodell Perna on 10/18/2019 13:02:43 Bartoletti, Trenise ([id number]) -------------------------------------------------------------------------------- Encounter Discharge Information Details Patient Name: [name] Date of Service: 10/18/2019 1:00 PM Medical Record Number: [id number] Patient Account Number: [id number] Date of Birth/Sex: Oct 19, 1970 (49 y.o. F) Treating RN: Huel Coventry Primary Care Stori Royse: Sandrea Hughs Other Clinician: Referring Erendira Crabtree: Sandrea Hughs Treating Taisei Bonnette/Extender: Altamese [place] in Treatment: 2 Encounter Discharge Information Items Post Procedure Vitals Discharge Condition: Stable Temperature (F): 98.9 Ambulatory Status: Ambulatory Pulse (bpm): 85 Discharge Destination: Home Respiratory Rate (breaths/min): 16 Transportation: Private Auto Blood Pressure (mmHg): 136/75 Accompanied By: self Schedule Follow-up Appointment:  Yes Clinical Summary of Care: Electronic Signature(s) Signed: 11/08/2019 11:11:47 AM By: Elliot Gurney, BSN, RN, CWS, Kim RN, BSN Entered By: Elliot Gurney, BSN, RN, CWS, Kim on 10/18/2019 13:32:06 Parker Strip, Lavayah ([id number]) -------------------------------------------------------------------------------- Lower Extremity Assessment Details Patient Name: [name] Date of Service: 10/18/2019 1:00 PM Medical Record Number: [id number] Patient Account Number: [id number] Date of Birth/Sex: 10-30-1970 (48 y.o. F) Treating RN: Rodell Perna Primary Care Kielan Dreisbach: Sandrea Hughs Other Clinician: Referring Lucas Winograd: Sandrea Hughs Treating Branden Vine/Extender: Maxwell Caul Weeks in Treatment: 2 Edema Assessment Assessed: [Left: No] [Right: No] Edema: [Left: N] [Right: o] Calf Left: Right: Point of Measurement: 27 cm From Medial Instep cm 35 cm Ankle Left: Right: Point of Measurement: 9 cm From Medial Instep cm 22 cm Vascular Assessment Pulses: Dorsalis Pedis Palpable: [Right:Yes] Electronic Signature(s) Signed: 10/19/2019 3:40:54 PM By: Rodell Perna Entered By: Rodell Perna on 10/18/2019 13:13:03 Vaeth, Aryani ([id number]) -------------------------------------------------------------------------------- Multi Wound Chart Details Patient Name: [name] Date of Service: 10/18/2019 1:00 PM Medical Record Number: [id number] Patient Account Number: [id number] Date of Birth/Sex: 10-10-1970 (49 y.o. F) Treating RN: Huel Coventry Primary Care Yanilen Adamik: Sandrea Hughs Other Clinician: Referring Salah Nakamura: Sandrea Hughs Treating Siegfried Vieth/Extender: Altamese [place] in Treatment: 2 Vital Signs Height(in): 60 Pulse(bpm): 85 Weight(lbs): 164 Blood Pressure(mmHg): 136/70 Body Mass Index(BMI): 32 Temperature(F): 98.9 Respiratory Rate(breaths/min): 16 Photos: [N/A:N/A] Wound Location: Right Lower Leg - Anterior, Proximal Right Lower Leg - Anterior, Distal N/A Wounding Event: Trauma Trauma  N/A Primary Etiology: Diabetic Wound/Ulcer of the Lower Diabetic Wound/Ulcer of the Lower N/A Extremity Extremity Comorbid History: Type II Diabetes Type II Diabetes N/A Date Acquired: 09/05/2019 09/05/2019 N/A Weeks of Treatment: 2 2 N/A Wound Status: Open Open N/A Measurements L x W x D (cm) 0.4x0.4x0.1 1.5x0.8x0.1 N/A Area (cm) : 0.126 0.942 N/A Volume (cm) : 0.013 0.094 N/A % Reduction in Area: 67.90% 50.00% N/A % Reduction in Volume: 66.70% 50.00% N/A Classification: Grade 2 Grade 2 N/A Exudate Amount: Medium Medium N/A Exudate Type: Serosanguineous Serosanguineous N/A Exudate Color: red, brown red, brown N/A Wound Margin:  Flat and Intact Flat and Intact N/A Granulation Amount: Large (67-100%) Large (67-100%) N/A Granulation Quality: Red Red N/A Necrotic Amount: Small (1-33%) Small (1-33%) N/A Necrotic Tissue: Eschar Eschar, Adherent Slough N/A Exposed Structures: Fat Layer (Subcutaneous Tissue) Fat Layer (Subcutaneous Tissue) N/A Exposed: Yes Exposed: Yes Fascia: No Fascia: No Tendon: No Tendon: No Muscle: No Muscle: No Joint: No Joint: No Bone: No Bone: No Epithelialization: Small (1-33%) Small (1-33%) N/A Debridement: Debridement - Selective/Open N/A N/A Wound Pre-procedure Verification/Time 13:24 N/A N/A Out Taken: Pain Control: Lidocaine N/A N/A Tissue Debrided: Slough N/A N/A Level: Non-Viable Tissue N/A N/A Debridement Area (sq cm): 0.16 N/A N/A Streb, Ekaterini ([id number]) Instrument: Curette N/A N/A Bleeding: Minimum N/A N/A Hemostasis Achieved: Pressure N/A N/A Debridement Treatment Procedure was tolerated well N/A N/A Response: Post Debridement Measurements 0.4x0.4x0.1 N/A N/A L x W x D (cm) Post Debridement Volume: (cm) 0.013 N/A N/A Procedures Performed: Debridement N/A N/A Treatment Notes Wound #3 (Right, Proximal, Anterior Lower Leg) 1. Cleansed with: Clean wound with Normal Saline 2. Anesthetic Topical Lidocaine 4% cream to wound bed prior to  debridement Hurricaine Topical Anesthetic Spray 4. Dressing Applied: Hydrafera Blue 5. Secondary Dressing Applied ABD Pad 7. Secured with 3 Layer Compression System - Right Lower Extremity Wound #4 (Right, Distal, Anterior Lower Leg) 1. Cleansed with: Clean wound with Normal Saline 2. Anesthetic Topical Lidocaine 4% cream to wound bed prior to debridement Hurricaine Topical Anesthetic Spray 4. Dressing Applied: Hydrafera Blue 5. Secondary Dressing Applied ABD Pad 7. Secured with 3 Layer Compression System - Right Lower Extremity Electronic Signature(s) Signed: 10/19/2019 4:13:18 AM By: Baltazar Najjar MD Entered By: Baltazar Najjar on 10/18/2019 13:37:14 Cefalu, Shriley ([id number]) -------------------------------------------------------------------------------- Multi-Disciplinary Care Plan Details Patient Name: [name] Date of Service: 10/18/2019 1:00 PM Medical Record Number: [id number] Patient Account Number: [id number] Date of Birth/Sex: 03-24-1971 (48 y.o. F) Treating RN: Huel Coventry Primary Care Dent Plantz: Sandrea Hughs Other Clinician: Referring Kajol Crispen: Sandrea Hughs Treating Zymiere Trostle/Extender: Altamese [place] in Treatment: 2 Active Inactive Orientation to the Wound Care Program Nursing Diagnoses: Knowledge deficit related to the wound healing center program Goals: Patient/caregiver will verbalize understanding of the Wound Healing Center Program Date Initiated: 10/04/2019 Target Resolution Date: 10/11/2019 Goal Status: Active Interventions: Provide education on orientation to the wound center Notes: Wound/Skin Impairment Nursing Diagnoses: Impaired tissue integrity Knowledge deficit related to ulceration/compromised skin integrity Goals: Ulcer/skin breakdown will have a volume reduction of 30% by week 4 Date Initiated: 10/04/2019 Target Resolution Date: 11/04/2019 Goal Status: Active Interventions: Assess patient/caregiver ability to obtain  necessary supplies Assess ulceration(s) every visit Treatment Activities: Skin care regimen initiated : 10/04/2019 Topical wound management initiated : 10/04/2019 Notes: Electronic Signature(s) Signed: 11/08/2019 11:11:47 AM By: Elliot Gurney, BSN, RN, CWS, Kim RN, BSN Entered By: Elliot Gurney, BSN, RN, CWS, Kim on 10/18/2019 13:25:46 Ybarbo, Naya ([id number]) -------------------------------------------------------------------------------- Pain Assessment Details Patient Name: [name] Date of Service: 10/18/2019 1:00 PM Medical Record Number: [id number] Patient Account Number: [id number] Date of Birth/Sex: 1971-02-21 (48 y.o. F) Treating RN: Rodell Perna Primary Care Ivy Meriwether: Sandrea Hughs Other Clinician: Referring Macarena Langseth: Sandrea Hughs Treating Velvie Thomaston/Extender: Maxwell Caul Weeks in Treatment: 2 Active Problems Location of Pain Severity and Description of Pain Patient Has Paino No Site Locations Pain Management and Medication Current Pain Management: Electronic Signature(s) Signed: 10/19/2019 3:40:54 PM By: Rodell Perna Entered By: Rodell Perna on 10/18/2019 13:03:11 Hogue, Katricia ([id number]) -------------------------------------------------------------------------------- Patient/Caregiver Education Details Patient Name: [name] Date of Service: 10/18/2019 1:00 PM Medical Record Number: [id number] Patient Account Number:  [id number] Date of Birth/Gender: 1970/12/02 (49 y.o. F) Treating RN: Huel Coventry Primary Care Physician: Sandrea Hughs Other Clinician: Referring Physician: Sandrea Hughs Treating Physician/Extender: Altamese [place] in Treatment: 2 Education Assessment Education Provided To: Patient Education Topics Provided Wound Debridement: Handouts: Wound Debridement Methods: Demonstration, Explain/Verbal Responses: State content correctly Electronic Signature(s) Signed: 11/08/2019 11:11:47 AM By: Elliot Gurney, BSN, RN, CWS, Kim RN, BSN Entered By: Elliot Gurney, BSN,  RN, CWS, Kim on 10/18/2019 13:27:36 Merom, Shaylan ([id number]) -------------------------------------------------------------------------------- Wound Assessment Details Patient Name: [name] Date of Service: 10/18/2019 1:00 PM Medical Record Number: [id number] Patient Account Number: [id number] Date of Birth/Sex: 28-Nov-1970 (48 y.o. F) Treating RN: Rodell Perna Primary Care China Deitrick: Sandrea Hughs Other Clinician: Referring Virgel Haro: Sandrea Hughs Treating Brion Sossamon/Extender: Maxwell Caul Weeks in Treatment: 2 Wound Status Wound Number: 3 Primary Etiology: Diabetic Wound/Ulcer of the Lower Extremity Wound Location: Right Lower Leg - Anterior, Proximal Wound Status: Open Wounding Event: Trauma Comorbid History: Type II Diabetes Date Acquired: 09/05/2019 Weeks Of Treatment: 2 Clustered Wound: No Photos Wound Measurements Length: (cm) 0.4 Width: (cm) 0.4 Depth: (cm) 0.1 Area: (cm) 0.126 Volume: (cm) 0.013 % Reduction in Area: 67.9% % Reduction in Volume: 66.7% Epithelialization: Small (1-33%) Wound Description Classification: Grade 2 Wound Margin: Flat and Intact Exudate Amount: Medium Exudate Type: Serosanguineous Exudate Color: red, brown Foul Odor After Cleansing: No Slough/Fibrino Yes Wound Bed Granulation Amount: Large (67-100%) Exposed Structure Granulation Quality: Red Fascia Exposed: No Necrotic Amount: Small (1-33%) Fat Layer (Subcutaneous Tissue) Exposed: Yes Necrotic Quality: Eschar Tendon Exposed: No Muscle Exposed: No Joint Exposed: No Bone Exposed: No Electronic Signature(s) Signed: 10/19/2019 3:40:54 PM By: Rodell Perna Entered By: Rodell Perna on 10/18/2019 13:11:23 Mahar, Oniya ([id number]) -------------------------------------------------------------------------------- Wound Assessment Details Patient Name: [name] Date of Service: 10/18/2019 1:00 PM Medical Record Number: [id number] Patient Account Number: [id number] Date of  Birth/Sex: [birth year] (49 y.o. F) Treating RN: Rodell Perna Primary Care Suesan Mohrmann: Sandrea Hughs Other Clinician: Referring Odilia Damico: Sandrea Hughs Treating Ninetta Adelstein/Extender: Maxwell Caul Weeks in Treatment: 2 Wound Status Wound Number: 4 Primary Etiology: Diabetic Wound/Ulcer of the Lower Extremity Wound Location: Right Lower Leg - Anterior, Distal Wound Status: Open Wounding Event: Trauma Comorbid History: Type II Diabetes Date Acquired: 09/05/2019 Weeks Of Treatment: 2 Clustered Wound: No Photos Wound Measurements Length: (cm) 1.5 Width: (cm) 0.8 Depth: (cm) 0.1 Area: (cm) 0.942 Volume: (cm) 0.094 % Reduction in Area: 50% % Reduction in Volume: 50% Epithelialization: Small (1-33%) Wound Description Classification: Grade 2 Wound Margin: Flat and Intact Exudate Amount: Medium Exudate Type: Serosanguineous Exudate Color: red, brown Foul Odor After Cleansing: No Slough/Fibrino Yes Wound Bed Granulation Amount: Large (67-100%) Exposed Structure Granulation Quality: Red Fascia Exposed: No Necrotic Amount: Small (1-33%) Fat Layer (Subcutaneous Tissue) Exposed: Yes Necrotic Quality: Eschar, Adherent Slough Tendon Exposed: No Muscle Exposed: No Joint Exposed: No Bone Exposed: No Electronic Signature(s) Signed: 10/19/2019 3:40:54 PM By: Rodell Perna Entered By: Rodell Perna on 10/18/2019 13:11:43 Misch, Jessalynn ([id number]) -------------------------------------------------------------------------------- Vitals Details Patient Name: [name] Date of Service: 10/18/2019 1:00 PM Medical Record Number: [id number] Patient Account Number: [id number] Date of Birth/Sex: 01/22/1971 (49 y.o. F) Treating RN: Rodell Perna Primary Care Brysyn Brandenberger: Sandrea Hughs Other Clinician: Referring Ahnaf Caponi: Sandrea Hughs Treating Jeremy Ditullio/Extender: Altamese [place] in Treatment: 2 Vital Signs Time Taken: 13:02 Temperature (F): 98.9 Height (in): 60 Pulse (bpm):  85 Weight (lbs): 164 Respiratory Rate (breaths/min): 16 Body Mass Index (BMI): 32 Blood Pressure (mmHg): 136/70 Reference Range: 80 - 120 mg / dl Electronic Signature(s) Signed:  10/19/2019 3:40:54 PM By: Army Melia Entered By: Army Melia on 10/18/2019 13:03:05

## 2020-03-27 DIAGNOSIS — Z20822 Contact with and (suspected) exposure to covid-19: Secondary | ICD-10-CM | POA: Diagnosis not present

## 2020-05-29 DIAGNOSIS — Z124 Encounter for screening for malignant neoplasm of cervix: Secondary | ICD-10-CM | POA: Diagnosis not present

## 2020-05-29 DIAGNOSIS — R102 Pelvic and perineal pain: Secondary | ICD-10-CM | POA: Diagnosis not present

## 2020-05-29 DIAGNOSIS — E785 Hyperlipidemia, unspecified: Secondary | ICD-10-CM | POA: Diagnosis not present

## 2020-05-29 DIAGNOSIS — E1165 Type 2 diabetes mellitus with hyperglycemia: Secondary | ICD-10-CM | POA: Diagnosis not present

## 2020-05-29 DIAGNOSIS — Z1329 Encounter for screening for other suspected endocrine disorder: Secondary | ICD-10-CM | POA: Diagnosis not present

## 2020-07-04 DIAGNOSIS — E1165 Type 2 diabetes mellitus with hyperglycemia: Secondary | ICD-10-CM | POA: Diagnosis not present

## 2020-07-04 DIAGNOSIS — Z1329 Encounter for screening for other suspected endocrine disorder: Secondary | ICD-10-CM | POA: Diagnosis not present

## 2020-07-17 DIAGNOSIS — Z7189 Other specified counseling: Secondary | ICD-10-CM | POA: Diagnosis not present

## 2020-07-17 DIAGNOSIS — E1165 Type 2 diabetes mellitus with hyperglycemia: Secondary | ICD-10-CM | POA: Diagnosis not present

## 2020-07-17 DIAGNOSIS — Q383 Other congenital malformations of tongue: Secondary | ICD-10-CM | POA: Diagnosis not present

## 2020-07-17 DIAGNOSIS — Z1329 Encounter for screening for other suspected endocrine disorder: Secondary | ICD-10-CM | POA: Diagnosis not present

## 2020-07-25 DIAGNOSIS — Z23 Encounter for immunization: Secondary | ICD-10-CM | POA: Diagnosis not present

## 2020-11-01 DIAGNOSIS — E113393 Type 2 diabetes mellitus with moderate nonproliferative diabetic retinopathy without macular edema, bilateral: Secondary | ICD-10-CM | POA: Diagnosis not present

## 2021-01-29 DIAGNOSIS — Z1389 Encounter for screening for other disorder: Secondary | ICD-10-CM | POA: Diagnosis not present

## 2021-01-29 DIAGNOSIS — Z0000 Encounter for general adult medical examination without abnormal findings: Secondary | ICD-10-CM | POA: Diagnosis not present

## 2021-11-18 DIAGNOSIS — N760 Acute vaginitis: Secondary | ICD-10-CM | POA: Diagnosis not present

## 2021-11-18 DIAGNOSIS — Z1329 Encounter for screening for other suspected endocrine disorder: Secondary | ICD-10-CM | POA: Diagnosis not present

## 2021-11-18 DIAGNOSIS — Z1389 Encounter for screening for other disorder: Secondary | ICD-10-CM | POA: Diagnosis not present

## 2021-11-18 DIAGNOSIS — E1165 Type 2 diabetes mellitus with hyperglycemia: Secondary | ICD-10-CM | POA: Diagnosis not present

## 2021-11-18 DIAGNOSIS — Z13220 Encounter for screening for lipoid disorders: Secondary | ICD-10-CM | POA: Diagnosis not present

## 2021-11-18 DIAGNOSIS — Z0000 Encounter for general adult medical examination without abnormal findings: Secondary | ICD-10-CM | POA: Diagnosis not present

## 2021-11-18 DIAGNOSIS — R300 Dysuria: Secondary | ICD-10-CM | POA: Diagnosis not present

## 2021-12-16 ENCOUNTER — Other Ambulatory Visit: Payer: Self-pay

## 2021-12-16 MED ORDER — OZEMPIC (0.25 OR 0.5 MG/DOSE) 2 MG/3ML ~~LOC~~ SOPN
0.5000 mg | PEN_INJECTOR | SUBCUTANEOUS | 11 refills | Status: AC
  Filled 2021-12-16: qty 3, 28d supply, fill #0
  Filled 2022-01-12: qty 3, 28d supply, fill #1
  Filled 2022-03-09: qty 3, 28d supply, fill #2
  Filled 2022-04-27: qty 3, 28d supply, fill #3

## 2022-01-12 ENCOUNTER — Other Ambulatory Visit: Payer: Self-pay

## 2022-02-02 ENCOUNTER — Encounter: Payer: 59 | Attending: Physician Assistant | Admitting: Physician Assistant

## 2022-02-02 DIAGNOSIS — E1165 Type 2 diabetes mellitus with hyperglycemia: Secondary | ICD-10-CM | POA: Insufficient documentation

## 2022-02-02 DIAGNOSIS — E11628 Type 2 diabetes mellitus with other skin complications: Secondary | ICD-10-CM | POA: Insufficient documentation

## 2022-02-02 DIAGNOSIS — L97328 Non-pressure chronic ulcer of left ankle with other specified severity: Secondary | ICD-10-CM | POA: Diagnosis not present

## 2022-02-02 NOTE — Progress Notes (Signed)
[place], [name] ([id number]) Visit Report for 02/02/2022 Allergy List Details Patient Name: [name], [name] Date of Service: 02/02/2022 10:00 AM Medical Record Number: [id number] Patient Account Number: [id number] Date of Birth/Sex: 1971-02-27 (51 y.o. F) Treating RN: Angelina Pih Primary Care Erabella Kuipers: Sandrea Hughs Other Clinician: Referring Hoover Grewe: Referral, Self Treating Tedi Hughson/Extender: Allen Derry Weeks in Treatment: 0 Allergies Active Allergies No Known Allergies Allergy Notes Electronic Signature(s) Signed: 02/02/2022 4:25:46 PM By: Angelina Pih Entered By: Angelina Pih on 02/02/2022 10:12:24 Rendell, Tanaisha ([id number]) -------------------------------------------------------------------------------- Arrival Information Details Patient Name: [name] Date of Service: 02/02/2022 10:00 AM Medical Record Number: [id number] Patient Account Number: [id number] Date of Birth/Sex: [birth year] (50 y.o. F) Treating RN: Angelina Pih Primary Care Jayme Mednick: Sandrea Hughs Other Clinician: Referring Veronica Fretz: Referral, Self Treating Cutler Sunday/Extender: Rowan Blase in Treatment: 0 Visit Information Patient Arrived: Ambulatory Arrival Time: 10:09 Accompanied By: self Transfer Assistance: None Patient Identification Verified: Yes Secondary Verification Process Completed: Yes History Since Last Visit Added or deleted any medications: No Any new allergies or adverse reactions: No Had a fall or experienced change in activities of daily living that may affect risk of falls: No Hospitalized since last visit: No Has Dressing in Place as Prescribed: No Electronic Signature(s) Signed: 02/02/2022 4:25:46 PM By: Angelina Pih Entered By: Angelina Pih on 02/02/2022 10:10:07 Lantz, Samaiyah ([id number]) -------------------------------------------------------------------------------- Clinic Level of Care Assessment Details Patient Name: [name] Date of Service: 02/02/2022  10:00 AM Medical Record Number: [id number] Patient Account Number: [id number] Date of Birth/Sex: [birth year] (50 y.o. F) Treating RN: Angelina Pih Primary Care Belina Mandile: Sandrea Hughs Other Clinician: Referring Kelbi Renstrom: Referral, Self Treating Jaretssi Kraker/Extender: Rowan Blase in Treatment: 0 Clinic Level of Care Assessment Items TOOL 2 Quantity Score []  - Use when only an EandM is performed on the INITIAL visit 0 ASSESSMENTS - Nursing Assessment / Reassessment X - General Physical Exam (combine w/ comprehensive assessment (listed just below) when performed on new 1 20 pt. evals) X- 1 25 Comprehensive Assessment (HX, ROS, Risk Assessments, Wounds Hx, etc.) ASSESSMENTS - Wound and Skin Assessment / Reassessment X - Simple Wound Assessment / Reassessment - one wound 1 5 []  - 0 Complex Wound Assessment / Reassessment - multiple wounds []  - 0 Dermatologic / Skin Assessment (not related to wound area) ASSESSMENTS - Ostomy and/or Continence Assessment and Care []  - Incontinence Assessment and Management 0 []  - 0 Ostomy Care Assessment and Management (repouching, etc.) PROCESS - Coordination of Care X - Simple Patient / Family Education for ongoing care 1 15 []  - 0 Complex (extensive) Patient / Family Education for ongoing care X- 1 10 Staff obtains , Records, Test Results / Process Orders []  - 0 Staff telephones HHA, Nursing Homes / Clarify orders / etc []  - 0 Routine Transfer to another Facility (non-emergent condition) []  - 0 Routine Hospital Admission (non-emergent condition) X- 1 15 New Admissions / / Ordering NPWT, Apligraf, etc. []  - 0 Emergency Hospital Admission (emergent condition) X- 1 10 Simple Discharge Coordination []  - 0 Complex (extensive) Discharge Coordination PROCESS - Special Needs []  - Pediatric / Minor Patient Management 0 []  - 0 Isolation Patient Management []  - 0 Hearing / Language / Visual special needs []  -  0 Assessment of Community assistance (transportation, D/C planning, etc.) []  - 0 Additional assistance / Altered mentation []  - 0 Support Surface(s) Assessment (bed, cushion, seat, etc.) INTERVENTIONS - Wound Cleansing / Measurement []  - Wound Imaging (photographs - any number of wounds) 0 []  - 0 Wound Tracing (instead of  photographs) []  - 0 Simple Wound Measurement - one wound []  - 0 Complex Wound Measurement - multiple wounds Calcaterra, Saina ( ) []  - 0 Simple Wound Cleansing - one wound []  - 0 Complex Wound Cleansing - multiple wounds INTERVENTIONS - Wound Dressings []  - Small Wound Dressing one or multiple wounds 0 []  - 0 Medium Wound Dressing one or multiple wounds []  - 0 Large Wound Dressing one or multiple wounds []  - 0 Application of Medications - injection INTERVENTIONS - Miscellaneous []  - External ear exam 0 []  - 0 Specimen Collection (cultures, biopsies, blood, body fluids, etc.) []  - 0 Specimen(s) / Culture(s) sent or taken to Lab for analysis []  - 0 Patient Transfer (multiple staff / / Similar devices) []  - 0 Simple Staple / Suture removal (25 or less) []  - 0 Complex Staple / Suture removal (26 or more) []  - 0 Hypo / Hyperglycemic Management (close monitor of Blood Glucose) []  - 0 Ankle / Brachial Index (ABI) - do not check if billed separately Has the patient been seen at the hospital within the last three years: Yes Total Score: 100 Level Of Care: New/Established - Level 3 Electronic Signature(s) Signed: 02/02/2022 4:25:46 PM By: Entered By: on 02/02/2022 10:57:02 Cater, Ernesta ( ) -------------------------------------------------------------------------------- Encounter Discharge Information Details Patient Name: Date of Service: 02/02/2022 10:00 AM Medical Record Number: Patient Account Number: Date of Birth/Sex: 07/28/1970 (50 y.o. F) Treating RN: Primary Care Ericha Whittingham: Nurse, adult Other Clinician: Referring Yishai Rehfeld: Referral, Self Treating Sakiya Stepka/Extender: in Treatment: 0 Encounter Discharge Information Items Discharge Condition: Stable Ambulatory Status: Ambulatory Discharge Destination: Home Transportation: Private Auto Accompanied By: self Schedule Follow-up Appointment: Yes Clinical Summary of Care: Electronic Signature(s) Signed: 02/02/2022 4:25:46 PM By: Entered By: on 02/02/2022 10:57:49 Seward, Diala (Angelina Pih) -------------------------------------------------------------------------------- Pain Assessment Details Patient Name: Angelina Pih Date of Service: 02/02/2022 10:00 AM Medical Record Number: [id number] Patient Account Number: [name] Date of Birth/Sex: 1971-06-18 (50 y.o. F) Treating RN: [id number] Primary Care Jafeth Mustin: [id number] Other Clinician: Referring Korryn Pancoast: Referral, Self Treating Netra Postlethwait/Extender: 07/22/1971 in Treatment: 0 Active Problems Location of Pain Severity and Description of Pain Patient Has Paino No Site Locations Rate the pain. Current Pain Level: 0 Pain Management and Medication Current Pain Management: Electronic Signature(s) Signed: 02/02/2022 4:25:46 PM By: Sandrea Hughs Entered By: Rowan Blase on 02/02/2022 10:10:14 Buskirk, Charlene (Angelina Pih) -------------------------------------------------------------------------------- Patient/Caregiver Education Details Patient Name: Angelina Pih Date of Service: 02/02/2022 10:00 AM Medical Record Number: [id number] Patient Account Number: [name] Date of Birth/Gender: Jun 30, 1971 (50 y.o. F) Treating RN: [id number] Primary Care Physician: [id number] Other Clinician: Referring Physician: Referral, Self Treating Physician/Extender: 07/22/1971 in Treatment: 0 Education Assessment Education Provided To: Patient Education Topics  Provided Wound/Skin Impairment: Handouts: Other: care of healed wound Methods: Explain/Verbal Responses: State content correctly Electronic Signature(s) Signed: 02/02/2022 4:25:46 PM By: Sandrea Hughs Entered By: Rowan Blase on 02/02/2022 10:57:25 Gibeault, Avril (Angelina Pih) -------------------------------------------------------------------------------- Vitals Details Patient Name: Angelina Pih Date of Service: 02/02/2022 10:00 AM Medical Record Number: [id number] Patient Account Number: [name] Date of Birth/Sex: Jul 11, 1971 (50 y.o. F) Treating RN: [id number] Primary Care Shalik Sanfilippo: [id number] Other Clinician: Referring Linwood Gullikson: Referral, Self Treating Katharin Schneider/Extender: 07/22/1971 in Treatment: 0 Vital Signs Time Taken: 10:10 Temperature (F): 98.1 Height (in): 60 Pulse (bpm): 92 Source: Stated Respiratory Rate (breaths/min): 18 Weight (lbs): 160 Blood Pressure (mmHg): 129/81 Source: Stated Reference Range: 80 -  120 mg / dl Body Mass Index (BMI): 31.2 Electronic Signature(s) Signed: 02/02/2022 4:25:46 PM By: Angelina Pih Entered By: Angelina Pih on 02/02/2022 10:12:05

## 2022-02-02 NOTE — Progress Notes (Signed)
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 History: Negative for: Anorexia/bulimia;  Confinement Anxiety Endocrine Medical History: Positive for: Type II Diabetes Negative for: Type I Diabetes Time with diabetes: 20 yrs started at pregnancy Treated with: Insulin, Oral agents Blood sugar tested every day: No Oncologic Medical History: Negative for: Received Chemotherapy; Received Radiation Immunizations Pneumococcal Vaccine: Received Pneumococcal Vaccination: No Implantable Devices None Family and Social History Cancer: No; Diabetes: Yes - Mother,Maternal Grandparents; Heart Disease: No; Hereditary Spherocytosis: No; Hypertension: Yes - Siblings; Kidney Disease: No; Lung Disease: No; Seizures: No; Stroke: No; Thyroid Problems: No; Tuberculosis: No; Never smoker; Marital Status - [name], [name] ([id number]) Single; Alcohol Use: Rarely - beer; Drug Use: No History; Caffeine Use: Daily - coffee; Financial Concerns: No; Food, Clothing or Shelter Needs: No; Support System Lacking: No; Transportation Concerns: No Electronic  Signature(s) Signed: 02/02/2022 4:25:46 PM By: Angelina Pih Signed: 02/02/2022 4:34:19 PM By: Lenda Kelp PA-C Entered By: Angelina Pih on 02/02/2022 10:13:43 Daisey, Merrill ([id number]) -------------------------------------------------------------------------------- SuperBill Details Patient Name: [name] Date of Service: 02/02/2022 Medical Record Number: [id number] Patient Account Number: [id number] Date of Birth/Sex: 1971/01/26 (50 y.o. 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

## 2022-02-02 NOTE — Progress Notes (Signed)
[place], [name] ([id number]) Visit Report for 02/02/2022 Abuse Risk Screen Details Patient Name: [name], [name] Date of Service: 02/02/2022 10:00 AM Medical Record Number: [id number] Patient Account Number: [id number] Date of Birth/Sex: November 21, 1970 (51 y.o. F) Treating RN: Angelina Pih Primary Care Anajulia Leyendecker: Sandrea Hughs Other Clinician: Referring Jaylee Freeze: Referral, Self Treating Tae Robak/Extender: Rowan Blase in Treatment: 0 Abuse Risk Screen Items Answer ABUSE RISK SCREEN: Has anyone close to you tried to hurt or harm you recentlyo No Do you feel uncomfortable with anyone in your familyo No Has anyone forced you do things that you didnot want to doo No Electronic Signature(s) Signed: 02/02/2022 4:25:46 PM By: Angelina Pih Entered By: Angelina Pih on 02/02/2022 10:13:50 Schillo, Shamia ([id number]) -------------------------------------------------------------------------------- Activities of Daily Living Details Patient Name: [name] Date of Service: 02/02/2022 10:00 AM Medical Record Number: [id number] Patient Account Number: [id number] Date of Birth/Sex: 1971/04/02 (50 y.o. F) Treating RN: Angelina Pih Primary Care Christabella Alvira: Sandrea Hughs Other Clinician: Referring Ahmya Bernick: Referral, Self Treating Carolanne Mercier/Extender: Rowan Blase in Treatment: 0 Activities of Daily Living Items Answer Activities of Daily Living (Please select one for each item) Drive Automobile Completely Able Take Medications Completely Able Use Telephone Completely Able Care for Appearance Completely Able Use Toilet Completely Able Bath / Shower Completely Able Dress Self Completely Able Feed Self Completely Able Walk Completely Able Get In / Out Bed Completely Able Housework Completely Able Prepare Meals Completely Able Handle Money Completely Able Shop for Self Completely Able Electronic Signature(s) Signed: 02/02/2022 4:25:46 PM By: Angelina Pih Entered By: Angelina Pih  on 02/02/2022 10:14:09 Beitz, Brenly ([id number]) -------------------------------------------------------------------------------- Education Screening Details Patient Name: [name] Date of Service: 02/02/2022 10:00 AM Medical Record Number: [id number] Patient Account Number: [id number] Date of Birth/Sex: 1970/12/30 (50 y.o. F) Treating RN: Angelina Pih Primary Care Valyn Latchford: Sandrea Hughs Other Clinician: Referring Fawn Desrocher: Referral, Self Treating Jennah Satchell/Extender: Rowan Blase in Treatment: 0 Learning Preferences/Education Level/Primary Language Learning Preference: Explanation, Demonstration, Video, Communication Board, Printed Material Preferred Language: Patient Declined Cognitive Barrier Language Barrier: No Translator Needed: No Memory Deficit: No Emotional Barrier: No Cultural/Religious Beliefs Affecting Medical Care: No Physical Barrier Impaired Vision: No Impaired Hearing: No Decreased Hand dexterity: No Knowledge/Comprehension Knowledge Level: High Comprehension Level: High Ability to understand written instructions: High Ability to understand verbal instructions: High Motivation Anxiety Level: Calm Cooperation: Cooperative Education Importance: Acknowledges Need Interest in Health Problems: Asks Questions Perception: Coherent Willingness to Engage in Self-Management High Activities: Readiness to Engage in Self-Management High Activities: Electronic Signature(s) Signed: 02/02/2022 4:25:46 PM By: Angelina Pih Entered By: Angelina Pih on 02/02/2022 10:14:29 [place], [employer] ([id number]) -------------------------------------------------------------------------------- Fall Risk Assessment Details Patient Name: [name] Date of Service: 02/02/2022 10:00 AM Medical Record Number: [id number] Patient Account Number: [id number] Date of Birth/Sex: [birth year] (50 y.o. F) Treating RN: Angelina Pih Primary Care Lewis Keats: Sandrea Hughs Other  Clinician: Referring Jeana Kersting: Referral, Self Treating Mackenzie Lia/Extender: Rowan Blase in Treatment: 0 Fall Risk Assessment Items Have you had 2 or more falls in the last 12 monthso 0 No Have you had any fall that resulted in injury in the last 12 monthso 0 No FALLS RISK SCREEN History of falling - immediate or within 3 months 0 No Secondary diagnosis (Do you have 2 or more medical diagnoseso) 0 No Ambulatory aid None/bed rest/wheelchair/nurse 0 Yes Crutches/cane/walker 0 No Furniture 0 No Intravenous therapy Access/Saline/Heparin Lock 0 No Gait/Transferring Normal/ bed rest/ wheelchair 0 Yes Weak (short steps with or without shuffle, stooped but able to lift head while walking, may 0 No  seek support from furniture) Impaired (short steps with shuffle, may have difficulty arising from chair, head down, impaired 0 No balance) Mental Status Oriented to own ability 0 Yes Electronic Signature(s) Signed: 02/02/2022 4:25:46 PM By: Angelina Pih Entered By: Angelina Pih on 02/02/2022 10:14:41 Morillo, Merlene ([id number]) -------------------------------------------------------------------------------- Nutrition Risk Screening Details Patient Name: [name] Date of Service: 02/02/2022 10:00 AM Medical Record Number: [id number] Patient Account Number: [id number] Date of Birth/Sex: Dec 18, 1970 (50 y.o. F) Treating RN: Angelina Pih Primary Care Weston Kallman: Sandrea Hughs Other Clinician: Referring Vara Mairena: Referral, Self Treating Jhoselyn Ruffini/Extender: Rowan Blase in Treatment: 0 Height (in): 60 Weight (lbs): 160 Body Mass Index (BMI): 31.2 Nutrition Risk Screening Items Score Screening NUTRITION RISK SCREEN: I have an illness or condition that made me change the kind and/or amount of food I eat 0 No I eat fewer than two meals per day 0 No I eat few fruits and vegetables, or milk products 0 No I have three or more drinks of beer, liquor or wine almost every day 0 No I  have tooth or mouth problems that make it hard for me to eat 0 No I don't always have enough money to buy the food I need 0 No I eat alone most of the time 0 No I take three or more different prescribed or over-the-counter drugs a day 0 No Without wanting to, I have lost or gained 10 pounds in the last six months 0 No I am not always physically able to shop, cook and/or feed myself 0 No Nutrition Protocols Good Risk Protocol 0 No interventions needed Moderate Risk Protocol High Risk Proctocol Risk Level: Good Risk Score: 0 Electronic Signature(s) Signed: 02/02/2022 4:25:46 PM By: Angelina Pih Entered By: Angelina Pih on 02/02/2022 10:14:50

## 2022-03-09 ENCOUNTER — Other Ambulatory Visit: Payer: Self-pay

## 2022-03-20 ENCOUNTER — Other Ambulatory Visit: Payer: Self-pay

## 2022-03-20 DIAGNOSIS — R7309 Other abnormal glucose: Secondary | ICD-10-CM | POA: Diagnosis not present

## 2022-03-20 DIAGNOSIS — E1165 Type 2 diabetes mellitus with hyperglycemia: Secondary | ICD-10-CM | POA: Diagnosis not present

## 2022-03-20 MED ORDER — EMPAGLIFLOZIN 25 MG PO TABS
ORAL_TABLET | Freq: Every day | ORAL | 2 refills | Status: AC
  Filled 2022-03-20: qty 90, 90d supply, fill #0

## 2022-03-20 MED ORDER — INSULIN GLARGINE-YFGN 100 UNIT/ML ~~LOC~~ SOPN
50.0000 [IU] | PEN_INJECTOR | Freq: Every morning | SUBCUTANEOUS | 3 refills | Status: AC
  Filled 2022-03-20 (×2): qty 45, 90d supply, fill #0

## 2022-03-20 MED ORDER — OZEMPIC (1 MG/DOSE) 4 MG/3ML ~~LOC~~ SOPN
PEN_INJECTOR | SUBCUTANEOUS | 3 refills | Status: AC
  Filled 2022-03-20 – 2022-06-17 (×3): qty 3, 28d supply, fill #0
  Filled 2022-10-09: qty 3, 28d supply, fill #1
  Filled 2022-12-17 (×2): qty 3, 28d supply, fill #2
  Filled 2023-01-14: qty 3, 28d supply, fill #3

## 2022-03-23 ENCOUNTER — Other Ambulatory Visit: Payer: Self-pay

## 2022-03-25 ENCOUNTER — Other Ambulatory Visit: Payer: Self-pay

## 2022-03-30 ENCOUNTER — Encounter: Payer: Self-pay | Admitting: Emergency Medicine

## 2022-03-30 ENCOUNTER — Other Ambulatory Visit: Payer: Self-pay

## 2022-03-30 ENCOUNTER — Emergency Department
Admission: EM | Admit: 2022-03-30 | Discharge: 2022-03-30 | Disposition: A | Discharge status: Home / Self Care | Payer: PRIVATE HEALTH INSURANCE | Attending: Emergency Medicine | Admitting: Emergency Medicine

## 2022-03-30 DIAGNOSIS — E119 Type 2 diabetes mellitus without complications: Secondary | ICD-10-CM | POA: Insufficient documentation

## 2022-03-30 DIAGNOSIS — T22112A Burn of first degree of left forearm, initial encounter: Secondary | ICD-10-CM | POA: Insufficient documentation

## 2022-03-30 DIAGNOSIS — T310 Burns involving less than 10% of body surface: Secondary | ICD-10-CM | POA: Diagnosis not present

## 2022-03-30 DIAGNOSIS — T22012A Burn of unspecified degree of left forearm, initial encounter: Secondary | ICD-10-CM | POA: Diagnosis present

## 2022-03-30 DIAGNOSIS — T22212A Burn of second degree of left forearm, initial encounter: Secondary | ICD-10-CM | POA: Diagnosis not present

## 2022-03-30 DIAGNOSIS — Y990 Civilian activity done for income or pay: Secondary | ICD-10-CM | POA: Insufficient documentation

## 2022-03-30 DIAGNOSIS — X088XXA Exposure to other specified smoke, fire and flames, initial encounter: Secondary | ICD-10-CM | POA: Insufficient documentation

## 2022-03-30 MED ORDER — CEPHALEXIN 500 MG PO CAPS
500.0000 mg | ORAL_CAPSULE | Freq: Once | ORAL | Status: AC
  Administered 2022-03-30: 500 mg via ORAL
  Filled 2022-03-30: qty 1

## 2022-03-30 MED ORDER — BACITRACIN 500 UNIT/GM EX OINT
1.0000 | TOPICAL_OINTMENT | Freq: Two times a day (BID) | CUTANEOUS | 0 refills | Status: AC
  Filled 2022-03-30: qty 15, 8d supply, fill #0

## 2022-03-30 MED ORDER — LIDOCAINE 4 % EX CREA
TOPICAL_CREAM | Freq: Once | CUTANEOUS | Status: AC
  Administered 2022-03-30: 1 via TOPICAL
  Filled 2022-03-30: qty 5

## 2022-03-30 MED ORDER — CEPHALEXIN 500 MG PO CAPS
500.0000 mg | ORAL_CAPSULE | Freq: Three times a day (TID) | ORAL | 0 refills | Status: AC
  Filled 2022-03-30: qty 21, 7d supply, fill #0

## 2022-03-30 MED ORDER — BACITRACIN ZINC 500 UNIT/GM EX OINT
TOPICAL_OINTMENT | Freq: Two times a day (BID) | CUTANEOUS | Status: DC
  Administered 2022-03-30: 1 via TOPICAL
  Filled 2022-03-30: qty 0.9

## 2022-03-30 NOTE — ED Provider Notes (Signed)
[facility] Emergency Department Provider Note     Event Date/Time   First MD Initiated Contact with Patient 03/30/22 1201     (approximate)   History   Burn   HPI  [name] is a 51 y.o. female presents to the ED for evaluation of a left forearm burn that occurred at work on Wednesday.  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  She has some pain and swelling to the arm and is concerned because she is diabetic.  Denies any fevers, chills, or sweats.  She has been managing this burn at home, with daily peroxide washing.    Physical Exam   Triage Vital Signs: ED Triage Vitals  Enc Vitals Group     BP 03/30/22 1149 138/82     Pulse Rate 03/30/22 1149 86     Resp 03/30/22 1149 18     Temp 03/30/22 1149 98 F (36.7 C)     Temp Source 03/30/22 1149 Oral     SpO2 03/30/22 1149 98 %     Weight 03/30/22 1150 169 lb 15.6 oz (77.1 kg)     Height 03/30/22 1150 5' (1.524 m)     Head Circumference --      Peak Flow --      Pain Score 03/30/22 1150 8     Pain Loc --      Pain Edu? --      Excl. in GC? --     Most recent vital signs: Vitals:   03/30/22 1149  BP: 138/82  Pulse: 86  Resp: 18  Temp: 98 F (36.7 C)  SpO2: 98%    General Awake, no distress. NAD CV:  Good peripheral perfusion.  RESP:  Normal effort. CTA ABD:  No distention.  SKIN:  Patient with a 4 cm circular burn to the volar right forearm.  The wound bed is dry and pink without exudate or purulence.  Some mild surrounding erythema and induration is noted.  Blister Is not in place.   ED Results / Procedures / Treatments   Labs (all labs ordered are listed, but only abnormal results are displayed) Labs Reviewed - No data to display   EKG    RADIOLOGY   No results found.   PROCEDURES:  Critical Care performed: No  Procedures   MEDICATIONS ORDERED IN ED: Medications  bacitracin ointment (has no administration in time  range)  cephALEXin (KEFLEX) capsule 500 mg (has no administration in time range)  lidocaine (LMX) 4 % cream (has no administration in time range)     IMPRESSION / MDM / ASSESSMENT AND PLAN / ED COURSE  I reviewed the triage vital signs and the nursing notes.                              Differential diagnosis includes, but is not limited to, burn, cellulitis, abscess  Patient's presentation is most consistent with acute, uncomplicated illness.  Patient's diagnosis is consistent with a burn to the right forearm. Patient will be discharged home with prescriptions for bacitracin and Keflex. Patient is to follow up with employee health as needed or otherwise directed. Patient is given ED precautions to return to the ED for any worsening or new symptoms.     FINAL CLINICAL IMPRESSION(S) / ED DIAGNOSES   Final diagnoses:  Burn (any degree) involving less than 10% of body surface  Rx / DC Orders   ED Discharge Orders          Ordered    cephALEXin (KEFLEX) 500 MG capsule  3 times daily        03/30/22 1245    bacitracin 500 UNIT/GM ointment  2 times daily        03/30/22 1245             Note:  This document was prepared using Dragon voice recognition software and may include unintentional dictation errors.    Lissa Hoard, PA-C 03/30/22 1302    Sharyn Creamer, MD 03/30/22 [phone]

## 2022-03-30 NOTE — Discharge Instructions (Addendum)
Keep the burn covered with a thin layer of antibiotic ointment.  Cleanse the wound daily with warm water and mild soap.  Avoid any use of alcohol or peroxide.  Take antibiotic as directed until all pills are gone.  Follow-up with [facility] employee health for ongoing wound care.  Mantenga la [place] con una fina capa de pomada antibitica. Limpia la herida diariamente con agua tibia y [place]. Evite cualquier uso de alcohol o perxido. Tome el antibitico segn las indicaciones hasta que se acaben todas las pastillas. Seguimiento de la salud de los empleados de [facility] para el cuidado continuo de las heridas.

## 2022-03-30 NOTE — ED Triage Notes (Signed)
Pt here with a burn to her left forearm that occurred at work on [employer]. Pt works in the [employer] at [employer]. Pt states she is a diabetic and her arm has been increasing in swelling and pain. Worker's comp case, incident # [id number]

## 2022-03-31 ENCOUNTER — Other Ambulatory Visit: Payer: Self-pay

## 2022-04-16 ENCOUNTER — Other Ambulatory Visit: Payer: Self-pay | Admitting: Primary Care

## 2022-04-16 DIAGNOSIS — N63 Unspecified lump in unspecified breast: Secondary | ICD-10-CM

## 2022-04-23 DIAGNOSIS — E113211 Type 2 diabetes mellitus with mild nonproliferative diabetic retinopathy with macular edema, right eye: Secondary | ICD-10-CM | POA: Diagnosis not present

## 2022-04-27 ENCOUNTER — Other Ambulatory Visit: Payer: Self-pay

## 2022-05-06 ENCOUNTER — Ambulatory Visit
Admission: RE | Admit: 2022-05-06 | Discharge: 2022-05-06 | Disposition: A | Discharge status: Home / Self Care | Payer: 59 | Source: Ambulatory Visit | Attending: Primary Care | Admitting: Primary Care

## 2022-05-06 ENCOUNTER — Other Ambulatory Visit: Payer: Self-pay | Admitting: Primary Care

## 2022-05-06 DIAGNOSIS — N63 Unspecified lump in unspecified breast: Secondary | ICD-10-CM | POA: Insufficient documentation

## 2022-05-06 DIAGNOSIS — Z1231 Encounter for screening mammogram for malignant neoplasm of breast: Secondary | ICD-10-CM

## 2022-06-17 ENCOUNTER — Other Ambulatory Visit: Payer: Self-pay

## 2022-06-29 ENCOUNTER — Other Ambulatory Visit: Payer: Self-pay

## 2022-06-29 DIAGNOSIS — Z1389 Encounter for screening for other disorder: Secondary | ICD-10-CM | POA: Diagnosis not present

## 2022-06-29 DIAGNOSIS — N12 Tubulo-interstitial nephritis, not specified as acute or chronic: Secondary | ICD-10-CM | POA: Diagnosis not present

## 2022-06-29 DIAGNOSIS — Z712 Person consulting for explanation of examination or test findings: Secondary | ICD-10-CM | POA: Diagnosis not present

## 2022-06-29 MED ORDER — CIPROFLOXACIN HCL 500 MG PO TABS
ORAL_TABLET | ORAL | 0 refills | Status: AC
  Filled 2022-06-29: qty 14, 7d supply, fill #0

## 2022-07-02 ENCOUNTER — Other Ambulatory Visit: Payer: Self-pay

## 2022-07-02 DIAGNOSIS — B379 Candidiasis, unspecified: Secondary | ICD-10-CM | POA: Diagnosis not present

## 2022-07-02 DIAGNOSIS — Z1389 Encounter for screening for other disorder: Secondary | ICD-10-CM | POA: Diagnosis not present

## 2022-07-02 DIAGNOSIS — E119 Type 2 diabetes mellitus without complications: Secondary | ICD-10-CM | POA: Diagnosis not present

## 2022-07-02 DIAGNOSIS — Z712 Person consulting for explanation of examination or test findings: Secondary | ICD-10-CM | POA: Diagnosis not present

## 2022-07-02 MED ORDER — JARDIANCE 25 MG PO TABS
ORAL_TABLET | ORAL | 2 refills | Status: AC
  Filled 2022-07-02: qty 90, 90d supply, fill #0

## 2022-07-02 MED ORDER — INSULIN GLARGINE-YFGN 100 UNIT/ML ~~LOC~~ SOPN
50.0000 [IU] | PEN_INJECTOR | Freq: Every morning | SUBCUTANEOUS | 3 refills | Status: AC
  Filled 2022-07-02: qty 15, 30d supply, fill #0
  Filled 2022-10-09: qty 15, 30d supply, fill #1
  Filled 2022-12-17: qty 15, 30d supply, fill #2
  Filled 2023-01-14: qty 15, 30d supply, fill #3
  Filled 2023-02-22: qty 15, 30d supply, fill #4

## 2022-07-02 MED ORDER — FLUCONAZOLE 150 MG PO TABS
ORAL_TABLET | ORAL | 0 refills | Status: DC
  Filled 2022-07-02: qty 2, 7d supply, fill #0

## 2022-07-03 ENCOUNTER — Other Ambulatory Visit (HOSPITAL_COMMUNITY): Payer: Self-pay

## 2022-07-03 ENCOUNTER — Other Ambulatory Visit: Payer: Self-pay

## 2022-07-07 ENCOUNTER — Other Ambulatory Visit: Payer: Self-pay

## 2022-07-08 ENCOUNTER — Other Ambulatory Visit: Payer: Self-pay

## 2022-07-13 ENCOUNTER — Other Ambulatory Visit (HOSPITAL_COMMUNITY): Payer: Self-pay

## 2022-07-13 ENCOUNTER — Ambulatory Visit
Admission: EM | Admit: 2022-07-13 | Discharge: 2022-07-13 | Disposition: A | Discharge status: Home / Self Care | Payer: 59 | Attending: Urgent Care | Admitting: Urgent Care

## 2022-07-13 DIAGNOSIS — R1030 Lower abdominal pain, unspecified: Secondary | ICD-10-CM

## 2022-07-13 LAB — POCT URINALYSIS DIP (MANUAL ENTRY)
Bilirubin, UA: NEGATIVE
Glucose, UA: 500 mg/dL — AB
Ketones, POC UA: NEGATIVE mg/dL
Leukocytes, UA: NEGATIVE
Nitrite, UA: NEGATIVE
Protein Ur, POC: NEGATIVE mg/dL
Spec Grav, UA: <=1.005 — AB (ref 1.010–1.025)
Urobilinogen, UA: 0.2 E.U./dL
pH, UA: 6 (ref 5.0–8.0)

## 2022-07-13 NOTE — ED Provider Notes (Signed)
Renaldo Fiddler    CSN: [id number] Arrival date & time: 07/13/22  1856      History   Chief Complaint Chief Complaint  Patient presents with   Groin Pain    HPI [name] is a 51 y.o. female.    Groin Pain    Patient presents to urgent care with complaint of pain "where her ovaries are".  She states the symptoms have been going on for 2 to 3 weeks.  She was seen by her PCP and symptoms have not improved.  She states when the symptoms occur she experiences a "fever" and "shakes".  She states this occurred today and she took ibuprofen which helped to resolve the symptoms.  She endorses previous history of similar symptoms with imaging that she describes as "put through the tube" without any diagnosis.  She denies history of colonoscopy or diagnosis of diverticulitis  Past Medical History:  Diagnosis Date   Diabetes mellitus without complication (HCC)     There are no problems to display for this patient.   History reviewed. No pertinent surgical history.  OB History   No obstetric history on file.      Home Medications    Prior to Admission medications   Medication Sig Start Date End Date Taking? Authorizing Provider  bacitracin 500 UNIT/GM ointment Apply 1 Application topically 2 (two) times daily. 03/30/22   Menshew, Charlesetta Ivory, PA-C  ciprofloxacin (CIPRO) 500 MG tablet Take 1 tablet by mouth twice a day for 7 days 06/29/22     empagliflozin (JARDIANCE) 25 MG TABS tablet TOME 1 TABLETA POR LA BOCA 1 VEZ AL DIA PARA DIABETES Y CORAZON 07/02/22     fluconazole (DIFLUCAN) 150 MG tablet Take 1 tablet by mouth single dose for yeast infection. May repeat in 7 days if symptoms persist 07/02/22     glipiZIDE (GLUCOTROL) 10 MG tablet Take 10 mg by mouth daily before breakfast.    [provider]  insulin glargine-yfgn (SEMGLEE, YFGN,) 100 UNIT/ML Pen Inject 50 Units into the skin every morning as directed. 07/02/22     insulin glargine-yfgn (SEMGLEE) 100  UNIT/ML Pen Inject 50 Units into the skin every morning. 03/20/22     magic mouthwash w/lidocaine SOLN Take 5 mLs by mouth 4 (four) times daily. 03/10/17   Joni Reining, PA-C  metFORMIN (GLUCOPHAGE) 1000 MG tablet Take by mouth.    [provider]  Semaglutide, 1 MG/DOSE, (OZEMPIC, 1 MG/DOSE,) 4 MG/3ML SOPN Inject 1 mg subcutaneously once a week for diabetes 03/20/22     Semaglutide,0.25 or 0.5MG /DOS, (OZEMPIC, 0.25 OR 0.5 MG/DOSE,) 2 MG/3ML SOPN Inject 0.5 mg into the skin once a week. 11/18/21     silver sulfADIAZINE (SILVADENE) 1 % cream Apply to affected area daily 06/28/18   Joni Reining, PA-C  traMADol (ULTRAM) 50 MG tablet Take 1 tablet (50 mg total) by mouth every 12 (twelve) hours as needed. 06/28/18   Joni Reining, PA-C    Family History Family History  Problem Relation Age of Onset   Breast cancer Neg Hx     Social History Social History   Tobacco Use   Smoking status: Never   Smokeless tobacco: Never  Vaping Use   Vaping Use: Never used  Substance Use Topics   Alcohol use: Never   Drug use: Never     Allergies   Patient has no known allergies.   Review of Systems Review of Systems   Physical Exam Triage Vital  Signs ED Triage Vitals [07/13/22 1911]  Enc Vitals Group     BP 131/80     Pulse Rate 92     Resp 16     Temp 99 F (37.2 C)     Temp Source Oral     SpO2 97 %     Weight      Height      Head Circumference      Peak Flow      Pain Score 5     Pain Loc      Pain Edu?      Excl. in GC?    No data found.  Updated Vital Signs BP 131/80 (BP Location: Left Arm)   Pulse 92   Temp 99 F (37.2 C) (Oral)   Resp 16   LMP 07/13/2022 (Approximate)   SpO2 97%   Visual Acuity Right Eye Distance:   Left Eye Distance:   Bilateral Distance:    Right Eye Near:   Left Eye Near:    Bilateral Near:     Physical Exam Vitals reviewed.  Constitutional:      Appearance: Normal appearance.  Abdominal:     General: Abdomen is flat.      Palpations: Abdomen is soft.     Tenderness: There is abdominal tenderness in the right lower quadrant and left lower quadrant.  Skin:    General: Skin is warm and dry.  Neurological:     General: No focal deficit present.     Mental Status: She is alert and oriented to person, place, and time.  Psychiatric:        Mood and Affect: Mood normal.        Behavior: Behavior normal.      UC Treatments / Results  Labs (all labs ordered are listed, but only abnormal results are displayed) Labs Reviewed  POCT URINALYSIS DIP (MANUAL ENTRY)    EKG   Radiology No results found.  Procedures Procedures (including critical care time)  Medications Ordered in UC Medications - No data to display  Initial Impression / Assessment and Plan / UC Course  I have reviewed the triage vital signs and the nursing notes.  Pertinent labs & imaging results that were available during my care of the patient were reviewed by me and considered in my medical decision making (see chart for details).   Left and right lower quadrant abdominal tenderness with palpation.  Left significantly worse than right.  Reviewed possible diagnoses with patient including ovarian cyst, appendicitis, diverticulitis.  Recommended patient follow-up with ED evaluation if she felt her symptoms were significant.  Warned her to go to the ED if she developed fever, nausea and vomiting, worsening abdominal pain.   Final Clinical Impressions(s) / UC Diagnoses   Final diagnoses:  None   Discharge Instructions   None    ED Prescriptions   None    PDMP not reviewed this encounter.   Charma Igo, [place] 07/13/22 1936

## 2022-07-13 NOTE — Discharge Instructions (Signed)
Follow-up with your primary care provider to request evaluation of your symptoms.  If your symptoms are severe, recommend you go to the ED for evaluation immediately.  Watch for worsening symptoms including fever, nausea and vomiting, significantly worsening pain.

## 2022-07-13 NOTE — ED Triage Notes (Signed)
Pt. Presents to UC w/ c/o pain where her ovaries are. Pt. Sates this has been going on for the past 2-3 weeks and was seen by her PCP for it and symptoms have not gotten better. Pt. States when the these symptoms occur she experiences a fever and shakes, which she did today but has taken ibuprofen for it.

## 2022-07-15 ENCOUNTER — Emergency Department
Admission: EM | Admit: 2022-07-15 | Discharge: 2022-07-15 | Disposition: A | Discharge status: Home / Self Care | Payer: 59 | Attending: Emergency Medicine | Admitting: Emergency Medicine

## 2022-07-15 ENCOUNTER — Encounter: Payer: Self-pay | Admitting: Radiology

## 2022-07-15 ENCOUNTER — Other Ambulatory Visit: Payer: Self-pay

## 2022-07-15 ENCOUNTER — Other Ambulatory Visit: Payer: Self-pay | Admitting: Primary Care

## 2022-07-15 ENCOUNTER — Emergency Department: Payer: 59

## 2022-07-15 DIAGNOSIS — N10 Acute tubulo-interstitial nephritis: Secondary | ICD-10-CM | POA: Insufficient documentation

## 2022-07-15 DIAGNOSIS — N12 Tubulo-interstitial nephritis, not specified as acute or chronic: Secondary | ICD-10-CM

## 2022-07-15 DIAGNOSIS — E119 Type 2 diabetes mellitus without complications: Secondary | ICD-10-CM | POA: Diagnosis not present

## 2022-07-15 DIAGNOSIS — R1032 Left lower quadrant pain: Secondary | ICD-10-CM | POA: Diagnosis not present

## 2022-07-15 DIAGNOSIS — R102 Pelvic and perineal pain: Secondary | ICD-10-CM

## 2022-07-15 DIAGNOSIS — Z1152 Encounter for screening for COVID-19: Secondary | ICD-10-CM | POA: Diagnosis not present

## 2022-07-15 DIAGNOSIS — R1030 Lower abdominal pain, unspecified: Secondary | ICD-10-CM | POA: Diagnosis present

## 2022-07-15 LAB — COMPREHENSIVE METABOLIC PANEL
ALT: 41 U/L (ref 0–44)
AST: 64 U/L — ABNORMAL HIGH (ref 15–41)
Albumin: 3.3 g/dL — ABNORMAL LOW (ref 3.5–5.0)
Alkaline Phosphatase: 128 U/L — ABNORMAL HIGH (ref 38–126)
Anion gap: 9 (ref 5–15)
BUN: 20 mg/dL (ref 6–20)
CO2: 23 mmol/L (ref 22–32)
Calcium: 8.5 mg/dL — ABNORMAL LOW (ref 8.9–10.3)
Chloride: 103 mmol/L (ref 98–111)
Creatinine, Ser: 0.6 mg/dL (ref 0.44–1.00)
GFR, Estimated: >60 mL/min (ref >60)
Glucose, Bld: 201 mg/dL — ABNORMAL HIGH (ref 70–99)
Potassium: 3.7 mmol/L (ref 3.5–5.1)
Sodium: 135 mmol/L (ref 135–145)
Total Bilirubin: 0.8 mg/dL (ref 0.3–1.2)
Total Protein: 7 g/dL (ref 6.5–8.1)

## 2022-07-15 LAB — RESP PANEL BY RT-PCR (RSV, FLU A&B, COVID)  RVPGX2
Influenza A by PCR: NEGATIVE
Influenza B by PCR: NEGATIVE
Resp Syncytial Virus by PCR: NEGATIVE
SARS Coronavirus 2 by RT PCR: NEGATIVE

## 2022-07-15 LAB — URINALYSIS, ROUTINE W REFLEX MICROSCOPIC
Bilirubin Urine: NEGATIVE
Glucose, UA: >=500 mg/dL — AB
Ketones, ur: NEGATIVE mg/dL
Nitrite: NEGATIVE
Protein, ur: NEGATIVE mg/dL
Specific Gravity, Urine: 1.022 (ref 1.005–1.030)
pH: 6 (ref 5.0–8.0)

## 2022-07-15 LAB — CBC
HCT: 34.8 % — ABNORMAL LOW (ref 36.0–46.0)
Hemoglobin: 10.8 g/dL — ABNORMAL LOW (ref 12.0–15.0)
MCH: 26.2 pg (ref 26.0–34.0)
MCHC: 31 g/dL (ref 30.0–36.0)
MCV: 84.3 fL (ref 80.0–100.0)
Platelets: 394 10*3/uL (ref 150–400)
RBC: 4.13 MIL/uL (ref 3.87–5.11)
RDW: 16.4 % — ABNORMAL HIGH (ref 11.5–15.5)
WBC: 11 10*3/uL — ABNORMAL HIGH (ref 4.0–10.5)
nRBC: 0 % (ref 0.0–0.2)

## 2022-07-15 LAB — PREGNANCY, URINE: Preg Test, Ur: NEGATIVE

## 2022-07-15 LAB — LIPASE, BLOOD: Lipase: 33 U/L (ref 11–51)

## 2022-07-15 MED ORDER — CEFDINIR 300 MG PO CAPS
300.0000 mg | ORAL_CAPSULE | Freq: Two times a day (BID) | ORAL | 0 refills | Status: AC
  Filled 2022-07-15: qty 14, 7d supply, fill #0

## 2022-07-15 MED ORDER — IOHEXOL 300 MG/ML  SOLN
100.0000 mL | Freq: Once | INTRAMUSCULAR | Status: AC | PRN
  Administered 2022-07-15: 100 mL via INTRAVENOUS

## 2022-07-15 MED ORDER — SODIUM CHLORIDE 0.9 % IV SOLN
1.0000 g | Freq: Once | INTRAVENOUS | Status: AC
  Administered 2022-07-15: 1 g via INTRAVENOUS
  Filled 2022-07-15: qty 10

## 2022-07-15 MED ORDER — LACTATED RINGERS IV BOLUS
1000.0000 mL | Freq: Once | INTRAVENOUS | Status: AC
  Administered 2022-07-15: 1000 mL via INTRAVENOUS

## 2022-07-15 MED ORDER — ACETAMINOPHEN 500 MG PO TABS
1000.0000 mg | ORAL_TABLET | Freq: Once | ORAL | Status: AC
  Administered 2022-07-15: 1000 mg via ORAL
  Filled 2022-07-15: qty 2

## 2022-07-15 MED ORDER — KETOROLAC TROMETHAMINE 15 MG/ML IJ SOLN
15.0000 mg | Freq: Once | INTRAMUSCULAR | Status: AC
  Administered 2022-07-15: 15 mg via INTRAVENOUS
  Filled 2022-07-15: qty 1

## 2022-07-15 NOTE — ED Provider Notes (Signed)
[facility] Provider Note    Event Date/Time   First MD Initiated Contact with Patient 07/15/22 1300     (approximate)   History   Abdominal Pain   HPI  Mc Koelling is a 51 y.o. female past medical history of diabetes presents with lower abdominal pain and chills.  Patient tells me that she has had bilateral "ovarian pain" for the last 2 to 3 weeks.  Points to bilateral lower quadrants when asked about location of the pain.  Tells me that she was diagnosed with a urinary tract infection 3 weeks ago and took 7 days of antibiotic and symptoms initially improved but then of return.  Tells me she is walking very slowly and weak.  Denies dysuria urgency or frequency.  She is on her menstrual period currently.  Denies abnormal discharge.  Has not had any fevers at home but has had chills.  She is not nauseous or vomiting still tolerating p.o.  Patient denies diarrhea or constipation.     Past Medical History:  Diagnosis Date   Diabetes mellitus without complication ([facility])     There are no problems to display for this patient.    Physical Exam  Triage Vital Signs: ED Triage Vitals [07/15/22 1043]  Enc Vitals Group     BP (!) 148/73     Pulse Rate 98     Resp 17     Temp 98 F (36.7 C)     Temp src      SpO2 100 %     Weight      Height      Head Circumference      Peak Flow      Pain Score 8     Pain Loc      Pain Edu?      Excl. in [place]?     Most recent vital signs: Vitals:   07/15/22 1043 07/15/22 1437  BP: (!) 148/73 127/61  Pulse: 98 (!) 104  Resp: 17 16  Temp: 98 F (36.7 C)   SpO2: 100% 96%     General: Awake, no distress.  CV:  Good peripheral perfusion.  Resp:  Normal effort.  Abd:  No distention.  Mild tenderness to palpation in the left lower quadrant without guarding, no CVA tenderness Neuro:             Awake, Alert, Oriented x 3  Other:     ED Results / rocedures / Treatments  Labs (all labs ordered are listed, but  only abnormal results are displayed) Labs Reviewed  COMPREHENSIVE METABOLIC PANEL - Abnormal; Notable for the following components:      Result Value   Glucose, Bld 201 (*)    Calcium 8.5 (*)    Albumin 3.3 (*)    AST 64 (*)    Alkaline Phosphatase 128 (*)    All other components within normal limits  CBC - Abnormal; Notable for the following components:   WBC 11.0 (*)    Hemoglobin 10.8 (*)    HCT 34.8 (*)    RDW 16.4 (*)    All other components within normal limits  URINALYSIS, ROUTINE W REFLEX MICROSCOPIC - Abnormal; Notable for the following components:   Color, Urine YELLOW (*)    APPearance HAZY (*)    Glucose, UA >=500 (*)    Hgb urine dipstick LARGE (*)    Leukocytes,Ua SMALL (*)    Bacteria, UA RARE (*)    All other  components within normal limits  RESP PANEL BY RT-PCR (RSV, FLU A&B, COVID)  RVPGX2  URINE CULTURE  LIPASE, BLOOD  PREGNANCY, URINE     EKG     RADIOLOGY    PROCEDURES:  Critical Care performed: No  Procedures    MEDICATIONS ORDERED IN ED: Medications  cefTRIAXone (ROCEPHIN) 1 g in sodium chloride 0.9 % 100 mL IVPB (has no administration in time range)  acetaminophen (TYLENOL) tablet 1,000 mg (has no administration in time range)  ketorolac (TORADOL) 15 MG/ML injection 15 mg (15 mg Intravenous Given 07/15/22 1334)  lactated ringers bolus 1,000 mL (1,000 mLs Intravenous New Bag/Given 07/15/22 1333)  iohexol (OMNIPAQUE) 300 MG/ML solution 100 mL (100 mLs Intravenous Contrast Given 07/15/22 1353)     IMPRESSION / MDM / ASSESSMENT AND PLAN / ED COURSE  I reviewed the triage vital signs and the nursing notes.                              Patient's presentation is most consistent with acute presentation with potential threat to life or bodily function.  Differential diagnosis includes, but is not limited to, UTI, pyelonephritis, diverticulitis, appendicitis, kidney stone, viral illness  The patient is a 51 year old female presenting  with bilateral lower quadrant abdominal pain and chills.  This been going on for about 3 weeks.  Initially was seen and diagnosed with UTI she tells me and took antibiotics felt better but symptoms have returned.  She points to the bilateral lower quadrants when asked about location of her pain.  However on abdominal exam she really has minimal tenderness it is primarily in the left lower quadrant.  There is no guarding.  Her vital signs are reassuring she is not tachycardic or febrile.  She has no urinary symptoms no vaginal discharge.  Labs are notable for mild leukocytosis to 11 UA with significant white cells and red cells but there are squames and patient is on her menstrual period so unclear what this means.  She is not having any frank dysuria.  Given the duration of symptoms will obtain a CT abdomen pelvis to evaluate for renal abscess or other complication of UTI and rule out diverticulitis or atypical appendicitis.  Patient CT is consistent with a left-sided pyelonephritis.  Patient given first dose of Rocephin in the ED.  She is tolerating p.o. and I have low suspicion for sepsis at this time.  Discussed findings with the patient.  Will discharge with 7 days of cefdinir.  Discussed primary care follow-up Clinical Course as of 07/15/22 1457  Wed Jul 15, 2022  1413 Preg Test, Ur: NEGATIVE [KM]    Clinical Course User Index [KM] Georga Hacking, MD     FINAL CLINICAL IMPRESSION(S) / ED DIAGNOSES   Final diagnoses:  Pyelonephritis     Rx / DC Orders   ED Discharge Orders          Ordered    cefdinir (OMNICEF) 300 MG capsule  2 times daily        07/15/22 1455             Note:  This document was prepared using Dragon voice recognition software and may include unintentional dictation errors.   Georga Hacking, MD 07/15/22 [phone]

## 2022-07-15 NOTE — ED Triage Notes (Signed)
Pt come with c/o lower pelvic abdominal pain the patient stats this all started over month ago. Pt stats she has urinary infection and was given meds but still having pain.  Pt states no N/V. Pt also states headache. Pt states she  took meds this am and it relieved some of belly pain but not headache.

## 2022-07-15 NOTE — ED Notes (Signed)
Pt to ED for lower abdominal pain (has URI) and HA. Daughter at bedside.

## 2022-07-15 NOTE — ED Provider Triage Note (Signed)
  Emergency Medicine Provider Triage Evaluation Note  [name] , a 51 y.o.female,  was evaluated in triage.  Pt complains of abdominal pain.  Patient states that for the past month, she has had persistent pain around "where my ovaries are".  She states that it is predominantly in the left lower quadrant, however has also occurred in the right lower quadrant as well.  She was previously treated for a possible urinary tract infection, though this did not help her symptoms.    Review of Systems  Positive: Lower abdominal pain. Negative: Denies fever, chest pain, vomiting  Physical Exam   Vitals:   07/15/22 1043  BP: (!) 148/73  Pulse: 98  Resp: 17  Temp: 98 F (36.7 C)  SpO2: 100%   Gen:   Awake, no distress   Resp:  Normal effort  MSK:   Moves extremities without difficulty  Other:    Medical Decision Making  Given the patient's initial medical screening exam, the following diagnostic evaluation has been ordered. The patient will be placed in the appropriate treatment space, once one is available, to complete the evaluation and treatment. I have discussed the plan of care with the patient and I have advised the patient that an ED physician or mid-level practitioner will reevaluate their condition after the test results have been received, as the results may give them additional insight into the type of treatment they may need.    Diagnostics: Labs, UA, transvaginal ultrasound  Treatments: none immediately   Varney Daily, [place] 07/15/22 1118

## 2022-07-15 NOTE — Discharge Instructions (Addendum)
Please take the antibiotic twice daily for the next 7 days.  You can take Tylenol Motrin for pain and chills.  Please follow-up with your primary care doctor.  If you are still having chills and fever 48 hours from now please return to the emergency department.

## 2022-07-18 LAB — URINE CULTURE: Culture: >=100000 — AB

## 2022-07-22 ENCOUNTER — Ambulatory Visit
Admission: RE | Admit: 2022-07-22 | Discharge: 2022-07-22 | Disposition: A | Discharge status: Home / Self Care | Payer: 59 | Source: Ambulatory Visit | Attending: Primary Care | Admitting: Primary Care

## 2022-07-22 DIAGNOSIS — R102 Pelvic and perineal pain: Secondary | ICD-10-CM | POA: Diagnosis not present

## 2022-07-22 DIAGNOSIS — B379 Candidiasis, unspecified: Secondary | ICD-10-CM | POA: Diagnosis not present

## 2022-07-24 ENCOUNTER — Other Ambulatory Visit: Payer: Self-pay

## 2022-07-24 MED ORDER — AMITRIPTYLINE HCL 25 MG PO TABS
25.0000 mg | ORAL_TABLET | Freq: Every day | ORAL | 3 refills | Status: AC
  Filled 2022-07-24: qty 30, 30d supply, fill #0

## 2022-07-28 ENCOUNTER — Other Ambulatory Visit: Payer: Self-pay

## 2022-07-28 MED ORDER — FLUCONAZOLE 150 MG PO TABS
ORAL_TABLET | ORAL | 0 refills | Status: AC
  Filled 2022-07-28: qty 2, 3d supply, fill #0

## 2022-10-09 ENCOUNTER — Other Ambulatory Visit: Payer: Self-pay

## 2022-10-21 DIAGNOSIS — H2511 Age-related nuclear cataract, right eye: Secondary | ICD-10-CM | POA: Diagnosis not present

## 2022-10-21 DIAGNOSIS — E113211 Type 2 diabetes mellitus with mild nonproliferative diabetic retinopathy with macular edema, right eye: Secondary | ICD-10-CM | POA: Diagnosis not present

## 2022-10-21 DIAGNOSIS — H31002 Unspecified chorioretinal scars, left eye: Secondary | ICD-10-CM | POA: Diagnosis not present

## 2022-10-21 DIAGNOSIS — E113393 Type 2 diabetes mellitus with moderate nonproliferative diabetic retinopathy without macular edema, bilateral: Secondary | ICD-10-CM | POA: Diagnosis not present

## 2022-12-17 ENCOUNTER — Other Ambulatory Visit: Payer: Self-pay

## 2023-01-14 ENCOUNTER — Other Ambulatory Visit: Payer: Self-pay

## 2023-01-26 ENCOUNTER — Other Ambulatory Visit: Payer: Self-pay

## 2023-01-26 DIAGNOSIS — Z0000 Encounter for general adult medical examination without abnormal findings: Secondary | ICD-10-CM | POA: Diagnosis not present

## 2023-01-26 DIAGNOSIS — Z0131 Encounter for examination of blood pressure with abnormal findings: Secondary | ICD-10-CM | POA: Diagnosis not present

## 2023-01-26 DIAGNOSIS — E1165 Type 2 diabetes mellitus with hyperglycemia: Secondary | ICD-10-CM | POA: Diagnosis not present

## 2023-01-26 DIAGNOSIS — E119 Type 2 diabetes mellitus without complications: Secondary | ICD-10-CM | POA: Diagnosis not present

## 2023-01-26 DIAGNOSIS — Z1389 Encounter for screening for other disorder: Secondary | ICD-10-CM | POA: Diagnosis not present

## 2023-01-26 DIAGNOSIS — Z712 Person consulting for explanation of examination or test findings: Secondary | ICD-10-CM | POA: Diagnosis not present

## 2023-01-26 MED ORDER — CVS PROBIOTIC PO CAPS: ORAL_CAPSULE | ORAL | 3 refills | Status: AC

## 2023-02-09 ENCOUNTER — Other Ambulatory Visit: Payer: Self-pay

## 2023-02-10 ENCOUNTER — Other Ambulatory Visit: Payer: Self-pay

## 2023-02-10 MED ORDER — OZEMPIC (1 MG/DOSE) 4 MG/3ML ~~LOC~~ SOPN
1.0000 mg | PEN_INJECTOR | SUBCUTANEOUS | 3 refills | Status: AC
  Filled 2023-02-10: qty 3, 28d supply, fill #0

## 2023-02-22 ENCOUNTER — Other Ambulatory Visit: Payer: Self-pay

## 2023-08-02 ENCOUNTER — Other Ambulatory Visit: Payer: Self-pay | Admitting: Primary Care

## 2023-08-02 DIAGNOSIS — Z1231 Encounter for screening mammogram for malignant neoplasm of breast: Secondary | ICD-10-CM
